# Patient Record
Sex: Female | Born: 1960 | Race: White | Hispanic: No | Smoking: Never smoker
Health system: Southern US, Community
[De-identification: ages and names within clinical notes are randomized; demographics above are authoritative.]

## PROBLEM LIST (undated history)

## (undated) DIAGNOSIS — Z87442 Personal history of urinary calculi: Secondary | ICD-10-CM

## (undated) DIAGNOSIS — N200 Calculus of kidney: Secondary | ICD-10-CM

## (undated) DIAGNOSIS — D649 Anemia, unspecified: Secondary | ICD-10-CM

## (undated) DIAGNOSIS — M1990 Unspecified osteoarthritis, unspecified site: Secondary | ICD-10-CM

## (undated) HISTORY — PX: COLONOSCOPY: SHX174

## (undated) HISTORY — PX: ABDOMINAL HYSTERECTOMY: SHX81

---

## 2010-04-18 ENCOUNTER — Emergency Department (HOSPITAL_COMMUNITY): Admission: EM | Admit: 2010-04-18 | Discharge: 2010-04-18 | Payer: Self-pay | Admitting: Emergency Medicine

## 2011-03-16 LAB — COMPREHENSIVE METABOLIC PANEL
CO2: 23 mEq/L (ref 19–32)
Calcium: 8.5 mg/dL (ref 8.4–10.5)
Chloride: 110 mEq/L (ref 96–112)
GFR calc non Af Amer: >60 mL/min (ref >60)
Potassium: 3.4 mEq/L — ABNORMAL LOW (ref 3.5–5.1)
Sodium: 139 mEq/L (ref 135–145)

## 2011-03-16 LAB — URINALYSIS, ROUTINE W REFLEX MICROSCOPIC
Bilirubin Urine: NEGATIVE
Ketones, ur: 15 mg/dL — AB
Protein, ur: 30 mg/dL — AB
Specific Gravity, Urine: 1.023 (ref 1.005–1.030)
Urobilinogen, UA: 1 mg/dL (ref 0.0–1.0)
pH: 7 (ref 5.0–8.0)

## 2011-03-16 LAB — DIFFERENTIAL
Basophils Absolute: 0 10*3/uL (ref 0.0–0.1)
Eosinophils Relative: 0 % (ref 0–5)
Lymphs Abs: 0.7 10*3/uL (ref 0.7–4.0)
Monocytes Absolute: 0.4 10*3/uL (ref 0.1–1.0)
Monocytes Relative: 4 % (ref 3–12)
Neutro Abs: 8.4 10*3/uL — ABNORMAL HIGH (ref 1.7–7.7)

## 2011-03-16 LAB — CBC
MCHC: 30.9 g/dL (ref 30.0–36.0)
WBC: 9.6 10*3/uL (ref 4.0–10.5)

## 2011-03-16 LAB — URINE MICROSCOPIC-ADD ON

## 2011-03-16 LAB — POCT PREGNANCY, URINE: Preg Test, Ur: NEGATIVE

## 2014-05-13 ENCOUNTER — Ambulatory Visit (INDEPENDENT_AMBULATORY_CARE_PROVIDER_SITE_OTHER): Payer: Self-pay | Admitting: Obstetrics & Gynecology

## 2014-05-13 ENCOUNTER — Encounter: Payer: Self-pay | Admitting: Obstetrics & Gynecology

## 2014-05-13 ENCOUNTER — Encounter (HOSPITAL_COMMUNITY): Payer: Self-pay | Admitting: Radiology

## 2014-05-13 ENCOUNTER — Encounter (HOSPITAL_COMMUNITY): Payer: Self-pay | Admitting: Emergency Medicine

## 2014-05-13 ENCOUNTER — Inpatient Hospital Stay (HOSPITAL_COMMUNITY): Payer: Medicaid Other

## 2014-05-13 ENCOUNTER — Emergency Department (HOSPITAL_COMMUNITY)
Admission: EM | Admit: 2014-05-13 | Discharge: 2014-05-13 | Disposition: A | Discharge status: Home / Self Care | Payer: Medicaid Other | Attending: Emergency Medicine | Admitting: Emergency Medicine

## 2014-05-13 ENCOUNTER — Inpatient Hospital Stay (HOSPITAL_COMMUNITY)
Admission: AD | Admit: 2014-05-13 | Discharge: 2014-05-13 | Disposition: A | Discharge status: Home / Self Care | Payer: Medicaid Other | Source: Ambulatory Visit | Attending: Obstetrics & Gynecology | Admitting: Obstetrics & Gynecology

## 2014-05-13 VITALS — BP 112/78 | HR 80 | Temp 98.4°F | Ht 65.0 in

## 2014-05-13 DIAGNOSIS — N814 Uterovaginal prolapse, unspecified: Secondary | ICD-10-CM | POA: Insufficient documentation

## 2014-05-13 DIAGNOSIS — R112 Nausea with vomiting, unspecified: Secondary | ICD-10-CM | POA: Insufficient documentation

## 2014-05-13 DIAGNOSIS — N1330 Unspecified hydronephrosis: Secondary | ICD-10-CM

## 2014-05-13 DIAGNOSIS — Q6589 Other specified congenital deformities of hip: Secondary | ICD-10-CM | POA: Insufficient documentation

## 2014-05-13 DIAGNOSIS — N819 Female genital prolapse, unspecified: Secondary | ICD-10-CM | POA: Diagnosis present

## 2014-05-13 DIAGNOSIS — R197 Diarrhea, unspecified: Secondary | ICD-10-CM | POA: Insufficient documentation

## 2014-05-13 DIAGNOSIS — Z87442 Personal history of urinary calculi: Secondary | ICD-10-CM | POA: Insufficient documentation

## 2014-05-13 DIAGNOSIS — K8020 Calculus of gallbladder without cholecystitis without obstruction: Secondary | ICD-10-CM | POA: Insufficient documentation

## 2014-05-13 DIAGNOSIS — N926 Irregular menstruation, unspecified: Secondary | ICD-10-CM | POA: Insufficient documentation

## 2014-05-13 DIAGNOSIS — M129 Arthropathy, unspecified: Secondary | ICD-10-CM | POA: Insufficient documentation

## 2014-05-13 DIAGNOSIS — Q6101 Congenital single renal cyst: Secondary | ICD-10-CM | POA: Insufficient documentation

## 2014-05-13 HISTORY — DX: Calculus of kidney: N20.0

## 2014-05-13 HISTORY — DX: Unspecified osteoarthritis, unspecified site: M19.90

## 2014-05-13 LAB — BASIC METABOLIC PANEL
BUN: 12 mg/dL (ref 6–23)
CALCIUM: 8.9 mg/dL (ref 8.4–10.5)
CHLORIDE: 104 meq/L (ref 96–112)
CO2: 26 meq/L (ref 19–32)
Creatinine, Ser: 0.77 mg/dL (ref 0.50–1.10)
GFR calc non Af Amer: >90 mL/min (ref >90)
Glucose, Bld: 110 mg/dL — ABNORMAL HIGH (ref 70–99)
POTASSIUM: 3.3 meq/L — AB (ref 3.7–5.3)
SODIUM: 143 meq/L (ref 137–147)

## 2014-05-13 LAB — CBC WITH DIFFERENTIAL/PLATELET
BASOS PCT: 0 % (ref 0–1)
Basophils Absolute: 0 10*3/uL (ref 0.0–0.1)
EOS PCT: 2 % (ref 0–5)
Eosinophils Absolute: 0.1 10*3/uL (ref 0.0–0.7)
HEMATOCRIT: 37.3 % (ref 36.0–46.0)
Hemoglobin: 12.7 g/dL (ref 12.0–15.0)
Lymphocytes Relative: 16 % (ref 12–46)
Lymphs Abs: 1.1 10*3/uL (ref 0.7–4.0)
MCH: 27.4 pg (ref 26.0–34.0)
MCHC: 34 g/dL (ref 30.0–36.0)
MCV: 80.6 fL (ref 78.0–100.0)
Monocytes Absolute: 0.6 10*3/uL (ref 0.1–1.0)
Monocytes Relative: 9 % (ref 3–12)
NEUTROS PCT: 73 % (ref 43–77)
Neutro Abs: 5 10*3/uL (ref 1.7–7.7)
PLATELETS: 246 10*3/uL (ref 150–400)
RBC: 4.63 MIL/uL (ref 3.87–5.11)
RDW: 14.5 % (ref 11.5–15.5)
WBC: 6.9 10*3/uL (ref 4.0–10.5)

## 2014-05-13 LAB — CREATININE, SERUM
Creatinine, Ser: 0.9 mg/dL (ref 0.50–1.10)
GFR calc Af Amer: 83 mL/min — ABNORMAL LOW (ref >90)
GFR calc non Af Amer: 72 mL/min — ABNORMAL LOW (ref >90)

## 2014-05-13 LAB — HCG, SERUM, QUALITATIVE: Preg, Serum: NEGATIVE

## 2014-05-13 MED ORDER — SODIUM CHLORIDE 0.9 % IV SOLN: 1000.0000 mL | INTRAVENOUS | Status: DC

## 2014-05-13 MED ORDER — ONDANSETRON HCL 4 MG/2ML IJ SOLN
4.0000 mg | Freq: Once | INTRAMUSCULAR | Status: AC
  Administered 2014-05-13: 4 mg via INTRAVENOUS
  Filled 2014-05-13: qty 2

## 2014-05-13 MED ORDER — IOHEXOL 300 MG/ML  SOLN
100.0000 mL | Freq: Once | INTRAMUSCULAR | Status: AC | PRN
  Administered 2014-05-13: 100 mL via INTRAVENOUS

## 2014-05-13 MED ORDER — GI COCKTAIL ~~LOC~~
30.0000 mL | Freq: Once | ORAL | Status: AC
  Administered 2014-05-13: 30 mL via ORAL
  Filled 2014-05-13: qty 30

## 2014-05-13 MED ORDER — SODIUM CHLORIDE 0.9 % IV SOLN
1000.0000 mL | Freq: Once | INTRAVENOUS | Status: AC
  Administered 2014-05-13: 1000 mL via INTRAVENOUS

## 2014-05-13 MED ORDER — OXYCODONE-ACETAMINOPHEN 5-325 MG PO TABS
1.0000 | ORAL_TABLET | Freq: Once | ORAL | Status: AC
  Administered 2014-05-13: 1 via ORAL
  Filled 2014-05-13: qty 1

## 2014-05-13 MED ORDER — ONDANSETRON 8 MG PO TBDP
8.0000 mg | ORAL_TABLET | Freq: Three times a day (TID) | ORAL | Status: DC | PRN
Start: 2014-05-13 — End: 2014-05-13

## 2014-05-13 NOTE — ED Notes (Signed)
Per Dr. Patria Maneampos - pt to be d/c'd to [facility] as they are expecting to see her.  Pt and family aware of plan and verbalize understanding.  Family to provide transport at this time.

## 2014-05-13 NOTE — Progress Notes (Signed)
Subjective:     Patient ID: Mariah CalkinsRaissa Morrison, female   DOB: 05/07/1961, 53 y.o.   MRN: [id number]  HPI  Pt reports 2-4 days of leaking urine.  She reports that yesterday she felt a mass this.  She denies h/o pain prior to this with the exception of  her arthritis.  She reports  abd and pelvic (side pain) today.  She denies assoc GI sx.  She reports NO small OR large bulge in the past.     Review of Systems     Objective:   Physical Exam BP 112/78  Pulse 80  Temp(Src) 98.4 F (36.9 C) (Oral)  Ht 5\' 5"  (1.651 m) Pt sitting on exam table crying and stating that she is in extreme pain.   GU: EGBUS: ~12cm mass between legs.  Tender to palpation A full bimanual exam was not done.       Assessment:     Given pts abd pain and pain from this mass, I recommend that pt be transferred to the MAU to get a CT scan.  It is not likely to be a simple uterine prolapse as that should not be painful.  It could be a bladder with the uterus but I recommend a CT first to ascertain WHAT is prolapsing.  It is also too tender to replace in ofc.      Plan:     To MAU as above.  Dr. Penne LashLeggett and charge nurse called

## 2014-05-13 NOTE — ED Notes (Signed)
Patient from home unable to ambulate well from arthritis, called EMS due to pelvic pain for five days with vaginal swelling.  Patient also reports incontinent of stool and urine today.  Patient laying on the floor at home then had bowel movement.  Patient lives alone but has a daughter who is with her today.  Alert and oriented.  Vomited yesterday.

## 2014-05-13 NOTE — MAU Provider Note (Signed)
History     CSN: [id number]  Arrival date and time: 05/13/14 1445   First Provider Initiated Contact with Patient 05/13/14 2009      No chief complaint on file.  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  Pt has been having 3 days of N/V/D and today felt large bulge between her legs.  Pt went to ED and had uterus reduced by ED MD.  Organs prolapsed again.  Clinic appt was arranged for this afternoon for eval / pessary fitting.  When pt arrived she was emotional and having upper GI pain (pt was very hungry at this point).  When Dr. Erin FullingHarraway Smith attempted to reduce procidentia, the patient became very upset.  Pt was sent to ED for eval.    When I arrived pt was resting comfortabley and admited to upper GI pain.  GI cocktail given.    Pt had CT performed with findings as follows: 1. Prolapse of the uterus, urinary bladder/ureters, and rectum.  2. 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  3. Cholelithiasis.  4. Bilateral renal cysts.  5. 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  6. Changes consistent with chronic inflammation of the colon.  Pt has been having irregular menstrual bleeding for approximately 3 years.  Pt had menopausal symptoms 3 years ago but has not had amenorrhea for one year yet.  Pt is currently having light vaginal bleeding.  Pt has not accessed ehatlh care sytem and has not had pap smear or irregular bleeding evaluated.     Past Medical History  Diagnosis Date  . Kidney calculi   . Arthritis     severe    History reviewed. No pertinent past surgical history.  Family History  Problem Relation Age of Onset  . Cancer Father   . Heart disease Father     History  Substance Use Topics  . Smoking status: Never Smoker   . Smokeless tobacco: Never Used  . Alcohol  Use: No    Allergies:  Allergies  Allergen Reactions  . No Known Allergies     Prescriptions prior to admission  Medication Sig Dispense Refill  . ibuprofen (ADVIL) 200 MG tablet Take 400 mg by mouth every 6 (six) hours as needed for moderate pain.         Review of Systems  Constitutional: Negative.   HENT: Negative.   Respiratory: Negative.   Cardiovascular: Negative.   Gastrointestinal: Positive for heartburn, nausea, vomiting and diarrhea.  Genitourinary:       POP prolapse, vaginal bleeding, no ulcers on vaginal mucosa Urinary incontinence  Psychiatric/Behavioral: Negative.    Physical Exam   Blood pressure 124/70, pulse 71, temperature 97.5 F (36.4 C), temperature source Oral, resp. rate 16, height 5\' 5"  (1.651 m), weight 200 lb (90.719 kg), last menstrual period 01/13/2014.  Physical Exam  Vitals reviewed. Constitutional: She is oriented to person, place, and time. She appears well-developed and well-nourished. No distress.  HENT:  Head: Normocephalic and atraumatic.  Neck: Neck supple.  Respiratory: Effort normal.  GI: Soft. She exhibits no distension. There is no tenderness. There is no rebound and no guarding.  Genitourinary:  Complete procidentia approximately 12 inches below labia, no ulcerations noted on vaginal mucosa  Once reduced posterior forchette >4 finger breadths across.   Musculoskeletal:  Limited range of motion.  Can't abduct hips  Neurological: She is alert and oriented to person, place, and time.  Skin: Skin is warm and dry.  Hirsutism on face  Psychiatric:  Tearful, but appropriate.    MAU Course  Procedures Pap smear Endometrial biopsy  ENDOMETRIAL BIOPSY     The indications for endometrial biopsy were reviewed.   Risks of the biopsy including cramping, bleeding, infection, uterine perforation, inadequate specimen and need for additional procedures  were discussed. The patient states she understands and agrees to undergo procedure  today. Consent was signed. Time out was performed. Urine HCG was negative. The cervix was prepped with Betadine. The 3 mm pipelle was introduced into the endometrial cavity without difficulty to a depth of 11 cm, and a moderate amount of tissue was obtained and sent to pathology. The instruments were removed from the patient's vagina. Minimal bleeding from the cervix was noted. The patient tolerated the procedure well.    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 Not large enough and expelled with talking.   3-Pt can ambulate and is having leakage of urine 4-Pt has help at home tonight 5-Pt ate hamburger and pudding without N/V/D or abdominal pain 6-[facility].  They will work her in tomorrow.  Pt may have transportation issues so will have to work on this as well tomorrow morning.  Loyce DysMarni Smith and [employer] are aware of the needs and will be helping to arrange transport / appt. 7-If patient can't be seen tomorrow, will need to call to check on patient and make sure she is still voiding and ambulating.       Lesly DukesKelly H Astria Jordahl 05/13/2014, 8:28 PM

## 2014-05-13 NOTE — ED Notes (Signed)
Initial Contact - pt resting on stretcher, pt reports feeling better at this time.  Pt denies needs/complaints.  IV infiltrated and removed at this time.  Dr. Patria Maneampos at bedside for re-eval.  Pt awaiting d/c paperwork.  Skin PWD.  A+Ox4.  Self repositioning for comfort.  NAD.

## 2014-05-13 NOTE — ED Notes (Signed)
Pt reports her uterus came out again, md made aware. Pt back in ED room.

## 2014-05-13 NOTE — ED Provider Notes (Addendum)
CSN: [id number]     Arrival date & time 05/13/14  [id number] History   First MD Initiated Contact with Patient 05/13/14 [phone]     Chief Complaint  Patient presents with  . Pelvic Pain     The history is provided by the patient.   patient reports nausea and vomiting and diarrhea over the past 3 days.  She states she's been unable to keep significant oral fluids down.  Approximately 2 days ago she developed swelling and mass down in her vaginal area.  She has a history of vaginal prolapse before in the past but never as severe as this.  Her uterus is unable to be placed back in her vagina the patient.  She reports some pelvic discomfort at this time.  Patient incontinent of stool and urine today.  Past Medical History  Diagnosis Date  . Arthritis   . Kidney calculi    No past surgical history on file. No family history on file. History  Substance Use Topics  . Smoking status: Not on file  . Smokeless tobacco: Not on file  . Alcohol Use: Not on file   OB History   Grav Para Term Preterm Abortions TAB SAB Ect Mult Living                 Review of Systems  Genitourinary: Positive for pelvic pain.  All other systems reviewed and are negative.     Allergies  No known allergies  Home Medications   Prior to Admission medications   Not on File   BP 120/74  Pulse 86  Temp(Src) 98.4 F (36.9 C) (Oral)  Resp 16  SpO2 99% Physical Exam  Nursing note and vitals reviewed. Constitutional: She is oriented to person, place, and time. She appears well-developed and well-nourished. No distress.  HENT:  Head: Normocephalic and atraumatic.  Eyes: EOM are normal.  Neck: Normal range of motion.  Cardiovascular: Normal rate, regular rhythm and normal heart sounds.   Pulmonary/Chest: Effort normal and breath sounds normal.  Abdominal: Soft. She exhibits no distension. There is no tenderness.  Genitourinary:  Large and nearly complete prolapse of her uterus.  No bleeding.  No tenderness of  the uterus.   Musculoskeletal: Normal range of motion.  Neurological: She is alert and oriented to person, place, and time.  Skin: Skin is warm and dry.  Psychiatric: She has a normal mood and affect. Judgment normal.    ED Course  Uterine Prolapse reduction Date/Time: 05/13/2014 10:15 AM Performed by: Lyanne CoAMPOS, Monea Pesantez M Authorized by: Lyanne CoAMPOS, Delva Derden M Consent: Verbal consent obtained. Consent given by: patient Local anesthesia used: no Patient sedated: no Patient tolerance: Patient tolerated the procedure well with no immediate complications. Comments: Reduction of uterine prolapse with manual reduction technique   (including critical care time) Labs Review Labs Reviewed  BASIC METABOLIC PANEL - Abnormal; Notable for the following:    Potassium 3.3 (*)    Glucose, Bld 110 (*)    All other components within normal limits  CBC WITH DIFFERENTIAL    Imaging Review No results found.   EKG Interpretation None      MDM   Final diagnoses:  Nausea vomiting and diarrhea  Uterine prolapse    Patient feels much better after reduction of uterine prolapse.  This was reduced without significant difficulty the bedside.  Abdominal exam is benign.  Patient feels much better after reduction of her prolapse uterus.  I suspect this occurred from Valsalva maneuver from her vomiting over  the past several days.  Labs, fluids, antiemetics at this time.  Patient does want anything for pain at this time as her pain is improved after reduction of her prolapsed uterus.    11:22 AM Patient feels much better.  Home with Zofran.  Home with OB/GYN followup.  She understands return to the ER for new or worsening symptoms.    Lyanne CoKevin M Beva Remund, MD 05/13/14 1122  12:52 PM At discharge the patient was being placed back on and her car when her uterus prolapse completely again.  I discussed her case with Dr. leg it of OB/GYN.  She spoke with the team [facility] who can [facility] her  now.  Patient will be transported via family and personal vehicle to the GYN clinic at this time.  Lyanne CoKevin M Tore Carreker, MD 05/13/14 [phone]

## 2014-05-13 NOTE — Progress Notes (Signed)
[facility] provided pt with a list of primary care resources and a [facility] [employer] Card application to help patient establish primary care.  °

## 2014-05-13 NOTE — ED Notes (Signed)
Pt back to [facility] with family, reports "it came back out again".  Pt c/o pain to site, 8/10.  Pt denies other complaints.  Dr. Patria Maneampos aware. Pt resting on stretcher with call bell in reach.

## 2014-05-13 NOTE — MAU Provider Note (Signed)
Call from [place] in Clinic reporting that patient sent from clinic with organ prolapse.  Dr. Erin FullingHarraway-Smith gave order for Cath UA, CBC diff, and CT abd/pelvic.   Patient unable to void, ordered serum preg pre CT order.  [facility].

## 2014-05-13 NOTE — ED Notes (Signed)
[facility]omens called and requesting [facility] is [facility]

## 2014-05-13 NOTE — ED Notes (Signed)
Bed: [id number] Expected date:  Expected time:  Means of arrival:  Comments: ems- n/v, vaginal pain

## 2014-05-14 ENCOUNTER — Telehealth (HOSPITAL_COMMUNITY): Payer: Self-pay | Admitting: Obstetrics & Gynecology

## 2014-05-14 NOTE — Telephone Encounter (Signed)
[facility] to coordinate [facility].  [facility] worked patient in for 2:45 pm today, originally patient indicated she could not get transportation until tomorrow, but now states "she will make it work to get there".  [facility] notified patient of $100 copay, patient indicated she could not pay this, but then agreed to pay if she were to be admitted.  Explained to patient that admission could not be confirmed until receiving physician examines her and assured patient that [facility] would coordinate any admissions/surgery for her.  Patient was in agreement with plan.  The appointment is 05/14/14 at 2:45pm in the [facility] office ( [address] [place] [id number]).  All records faxed to office at [phone].    I did speak with Tedra/ SW and she said hospital would not be able to authorize a taxi for trip from [place] to [place].

## 2014-05-16 ENCOUNTER — Encounter (HOSPITAL_COMMUNITY): Payer: Self-pay | Admitting: General Practice

## 2014-05-16 ENCOUNTER — Inpatient Hospital Stay (HOSPITAL_COMMUNITY)
Admission: AD | Admit: 2014-05-16 | Discharge: 2014-05-16 | Disposition: A | Discharge status: Home / Self Care | Payer: Medicaid Other | Source: Ambulatory Visit | Attending: Obstetrics & Gynecology | Admitting: Obstetrics & Gynecology

## 2014-05-16 DIAGNOSIS — R110 Nausea: Secondary | ICD-10-CM | POA: Insufficient documentation

## 2014-05-16 DIAGNOSIS — N814 Uterovaginal prolapse, unspecified: Secondary | ICD-10-CM | POA: Insufficient documentation

## 2014-05-16 DIAGNOSIS — R109 Unspecified abdominal pain: Secondary | ICD-10-CM | POA: Insufficient documentation

## 2014-05-16 LAB — CBC WITH DIFFERENTIAL/PLATELET
BASOS PCT: 1 % (ref 0–1)
Basophils Absolute: 0 10*3/uL (ref 0.0–0.1)
EOS ABS: 0.2 10*3/uL (ref 0.0–0.7)
Eosinophils Relative: 2 % (ref 0–5)
HCT: 36.8 % (ref 36.0–46.0)
Hemoglobin: 12.5 g/dL (ref 12.0–15.0)
Lymphocytes Relative: 20 % (ref 12–46)
Lymphs Abs: 1.6 10*3/uL (ref 0.7–4.0)
MCH: 28 pg (ref 26.0–34.0)
MCHC: 34 g/dL (ref 30.0–36.0)
MCV: 82.3 fL (ref 78.0–100.0)
MONOS PCT: 9 % (ref 3–12)
Monocytes Absolute: 0.7 10*3/uL (ref 0.1–1.0)
NEUTROS ABS: 5.4 10*3/uL (ref 1.7–7.7)
Neutrophils Relative %: 68 % (ref 43–77)
PLATELETS: 240 10*3/uL (ref 150–400)
RBC: 4.47 MIL/uL (ref 3.87–5.11)
RDW: 14.8 % (ref 11.5–15.5)
WBC: 7.9 10*3/uL (ref 4.0–10.5)

## 2014-05-16 LAB — BASIC METABOLIC PANEL
BUN: 12 mg/dL (ref 6–23)
CHLORIDE: 104 meq/L (ref 96–112)
CO2: 29 mEq/L (ref 19–32)
Calcium: 9.1 mg/dL (ref 8.4–10.5)
Creatinine, Ser: 0.71 mg/dL (ref 0.50–1.10)
GFR calc Af Amer: >90 mL/min (ref >90)
GLUCOSE: 97 mg/dL (ref 70–99)
Potassium: 3.3 mEq/L — ABNORMAL LOW (ref 3.7–5.3)
Sodium: 144 mEq/L (ref 137–147)

## 2014-05-16 MED ORDER — ONDANSETRON HCL 4 MG/2ML IJ SOLN
4.0000 mg | Freq: Four times a day (QID) | INTRAMUSCULAR | Status: DC | PRN
  Administered 2014-05-16: 4 mg via INTRAVENOUS
  Filled 2014-05-16: qty 2

## 2014-05-16 MED ORDER — PROMETHAZINE HCL 25 MG PO TABS: 25.0000 mg | ORAL_TABLET | Freq: Four times a day (QID) | ORAL | Status: DC | PRN

## 2014-05-16 MED ORDER — HYDROMORPHONE HCL PF 1 MG/ML IJ SOLN
1.0000 mg | INTRAMUSCULAR | Status: DC | PRN
  Administered 2014-05-16: 1 mg via INTRAVENOUS
  Filled 2014-05-16: qty 1

## 2014-05-16 MED ORDER — OXYCODONE-ACETAMINOPHEN 5-325 MG PO TABS: 1.0000 | ORAL_TABLET | Freq: Four times a day (QID) | ORAL | Status: DC | PRN

## 2014-05-16 MED ORDER — LACTATED RINGERS IV SOLN
INTRAVENOUS | Status: DC
  Administered 2014-05-16: 12:00:00 via INTRAVENOUS

## 2014-05-16 NOTE — MAU Note (Signed)
Pt has been seen by Dr. Penne LashLeggett for uterine prolapse and states she was also sent to be seen in [place] on Tues., awaiting results.  Pt states she is unable to hold her urine and is experiencing vaginal bleeding soaking a pad an hour.

## 2014-05-16 NOTE — Discharge Instructions (Signed)
Abdominal Pain, Adult °Many things can cause abdominal pain. Usually, abdominal pain is not caused by a disease and will improve without treatment. It can often be observed and treated at home. Your health care provider will do a physical exam and possibly order blood tests and X-rays to help determine the seriousness of your pain. However, in many cases, more time must pass before a clear cause of the pain can be found. Before that point, your health care provider may not know if you need more testing or further treatment. °HOME CARE INSTRUCTIONS  °Monitor your abdominal pain for any changes. The following actions may help to alleviate any discomfort you are experiencing: °· Only take over-the-counter or prescription medicines as directed by your health care provider. °· Do not take laxatives unless directed to do so by your health care provider. °· Try a clear liquid diet (broth, tea, or water) as directed by your health care provider. Slowly move to a bland diet as tolerated. °SEEK MEDICAL CARE IF: °· You have unexplained abdominal pain. °· You have abdominal pain associated with nausea or diarrhea. °· You have pain when you urinate or have a bowel movement. °· You experience abdominal pain that wakes you in the night. °· You have abdominal pain that is worsened or improved by eating food. °· You have abdominal pain that is worsened with eating fatty foods. °SEEK IMMEDIATE MEDICAL CARE IF:  °· Your pain does not go away within 2 hours. °· You have a fever. °· You keep throwing up (vomiting). °· Your pain is felt only in portions of the abdomen, such as the right side or the left lower portion of the abdomen. °· You pass bloody or black tarry stools. °MAKE SURE YOU: °· Understand these instructions.   °· Will watch your condition.   °· Will get help right away if you are not doing well or get worse.   °Document Released: 09/22/2005 Document Revised: 10/03/2013 Document Reviewed: 08/22/2013 °ExitCare® Patient  Information ©2014 [employer]. ° °

## 2014-05-16 NOTE — MAU Provider Note (Signed)
  History     CSN: [id number]  Arrival date and time: 05/16/14 1115   First Provider Initiated Contact with Patient 05/16/14 1156      No chief complaint on file.  HPI [id number] Patient's last menstrual period was 01/13/2014. Patient returns with pain and nausea and symptoms of complete procidentia. Sx began more than 3 days ago. She was [facility] for an appointment to see Dr. Noland Ford[facility]Lentz or [facility]. [facility] yesterday in [place].  Pertinent Gynecological History: Menses: post-menopausal Bleeding: post menopausal bleeding Contraception: none DES exposure: denies  Last pap: pending Date: 05/13/14   Past Medical History  Diagnosis Date  . Kidney calculi   . Arthritis     severe    History reviewed. No pertinent past surgical history.  Family History  Problem Relation Age of Onset  . Cancer Father   . Heart disease Father     History  Substance Use Topics  . Smoking status: Never Smoker   . Smokeless tobacco: Never Used  . Alcohol Use: No    Allergies:  Allergies  Allergen Reactions  . No Known Allergies     Prescriptions prior to admission  Medication Sig Dispense Refill  . ibuprofen (ADVIL) 200 MG tablet Take 400 mg by mouth every 6 (six) hours as needed for moderate pain.         Review of Systems  Constitutional: Negative for fever.  Gastrointestinal: Positive for nausea and vomiting.  Psychiatric/Behavioral: The patient has insomnia.    Physical Exam   Blood pressure 136/82, pulse 75, temperature 98 F (36.7 C), temperature source Oral, resp. rate 18, last menstrual period 01/13/2014.  Physical Exam  Constitutional: She is oriented to person, place, and time. She appears well-developed. She appears distressed.  Respiratory: Effort normal. No respiratory distress.  GI: Soft. She exhibits no distension.  Genitourinary: No vaginal discharge found.  Complete vaginal vault prolapse  Neurological: She is alert and oriented to person,  place, and time.  Skin: Skin is warm and dry.   CBC    Component Value Date/Time   WBC 7.9 05/16/2014 1225   RBC 4.47 05/16/2014 1225   HGB 12.5 05/16/2014 1225   HCT 36.8 05/16/2014 1225   PLT 240 05/16/2014 1225   MCV 82.3 05/16/2014 1225   MCH 28.0 05/16/2014 1225   MCHC 34.0 05/16/2014 1225   RDW 14.8 05/16/2014 1225   LYMPHSABS 1.6 05/16/2014 1225   MONOABS 0.7 05/16/2014 1225   EOSABS 0.2 05/16/2014 1225   BASOSABS 0.0 05/16/2014 1225    BMET    Component Value Date/Time   NA 144 05/16/2014 1225   K 3.3* 05/16/2014 1225   CL 104 05/16/2014 1225   CO2 29 05/16/2014 1225   GLUCOSE 97 05/16/2014 1225   BUN 12 05/16/2014 1225   CREATININE 0.71 05/16/2014 1225   CALCIUM 9.1 05/16/2014 1225   GFRNONAA >90 05/16/2014 1225   GFRAA >90 05/16/2014 1225      MAU Course  Procedures  MDM complex  Assessment and Plan  I [facility] to [facility] Jol[facility]akavid Goodman [facility] in urogynecology clinic who scheduled her for appt tomorrow 1030 500 [address] [place] Pain med and antiemetic for home med   Mariah Morrison 05/16/2014, 2:20 PM

## 2014-05-16 NOTE — MAU Note (Signed)
Pt was cleaned and assisted to dress giving her dry, disposable jacket and pants. Clean pad placed with mesh panties. Pt assisted to car via w/c.

## 2014-05-17 NOTE — MAU Provider Note (Signed)
See full ED note I completed.

## 2014-05-17 NOTE — Progress Notes (Signed)
Chart opened and portions printed to fax to [facility] after release of info. received.

## 2014-05-20 ENCOUNTER — Encounter (HOSPITAL_COMMUNITY): Payer: Self-pay | Admitting: *Deleted

## 2014-05-20 ENCOUNTER — Inpatient Hospital Stay (HOSPITAL_COMMUNITY)
Admission: EM | Admit: 2014-05-20 | Discharge: 2014-05-20 | Disposition: A | Discharge status: Home / Self Care | Payer: Medicaid Other | Source: Ambulatory Visit | Attending: Obstetrics & Gynecology | Admitting: Obstetrics & Gynecology

## 2014-05-20 DIAGNOSIS — N939 Abnormal uterine and vaginal bleeding, unspecified: Secondary | ICD-10-CM

## 2014-05-20 DIAGNOSIS — N819 Female genital prolapse, unspecified: Secondary | ICD-10-CM

## 2014-05-20 DIAGNOSIS — N926 Irregular menstruation, unspecified: Secondary | ICD-10-CM

## 2014-05-20 DIAGNOSIS — N950 Postmenopausal bleeding: Secondary | ICD-10-CM | POA: Insufficient documentation

## 2014-05-20 DIAGNOSIS — R112 Nausea with vomiting, unspecified: Secondary | ICD-10-CM | POA: Insufficient documentation

## 2014-05-20 DIAGNOSIS — N814 Uterovaginal prolapse, unspecified: Secondary | ICD-10-CM

## 2014-05-20 LAB — CBC
HEMATOCRIT: 35.3 % — AB (ref 36.0–46.0)
HEMOGLOBIN: 11.7 g/dL — AB (ref 12.0–15.0)
MCH: 27.6 pg (ref 26.0–34.0)
MCHC: 33.1 g/dL (ref 30.0–36.0)
MCV: 83.3 fL (ref 78.0–100.0)
Platelets: 226 10*3/uL (ref 150–400)
RBC: 4.24 MIL/uL (ref 3.87–5.11)
RDW: 14.9 % (ref 11.5–15.5)
WBC: 6 10*3/uL (ref 4.0–10.5)

## 2014-05-20 LAB — COMPREHENSIVE METABOLIC PANEL
ALT: 67 U/L — ABNORMAL HIGH (ref 0–35)
AST: 49 U/L — AB (ref 0–37)
Albumin: 3.1 g/dL — ABNORMAL LOW (ref 3.5–5.2)
Alkaline Phosphatase: 67 U/L (ref 39–117)
BUN: 11 mg/dL (ref 6–23)
CO2: 28 meq/L (ref 19–32)
CREATININE: 0.78 mg/dL (ref 0.50–1.10)
Calcium: 8.7 mg/dL (ref 8.4–10.5)
Chloride: 105 mEq/L (ref 96–112)
GFR calc non Af Amer: >90 mL/min (ref >90)
GLUCOSE: 95 mg/dL (ref 70–99)
Potassium: 3.3 mEq/L — ABNORMAL LOW (ref 3.7–5.3)
Sodium: 145 mEq/L (ref 137–147)
Total Bilirubin: 0.3 mg/dL (ref 0.3–1.2)
Total Protein: 6.2 g/dL (ref 6.0–8.3)

## 2014-05-20 MED ORDER — FERROUS SULFATE 325 (65 FE) MG PO TABS: 325.0000 mg | ORAL_TABLET | Freq: Two times a day (BID) | ORAL | Status: DC

## 2014-05-20 MED ORDER — DOCUSATE SODIUM 100 MG PO CAPS: 100.0000 mg | ORAL_CAPSULE | Freq: Two times a day (BID) | ORAL | Status: DC | PRN

## 2014-05-20 MED ORDER — MEGESTROL ACETATE 20 MG PO TABS: 40.0000 mg | ORAL_TABLET | Freq: Two times a day (BID) | ORAL | Status: DC

## 2014-05-20 NOTE — Discharge Instructions (Signed)

## 2014-05-20 NOTE — MAU Provider Note (Signed)
History     CSN: [id number]  Arrival date and time: 05/20/14 1005   First Provider Initiated Contact with Patient 05/16/14 1156      Chief Complaint  Patient presents with  . Vaginal Bleeding   Vaginal Bleeding Associated symptoms include nausea and vomiting. Pertinent negatives include no fever.   [id number] Patient's last menstrual period was 01/13/2014. Patient returns with increased vaginal bleeding and symptoms of complete procidentia; has been seen several times here. She has been seen at [facility] and scheduled for surgery. She came here because "the ambulance brought me here".  Reports severe bleeding, "blood was on the floor". She is screaming and cursing, saying she is "tired of everything, want surgery now".  Accompanied by her daughter who is also very upset.   Pertinent Gynecological History: Menses: post-menopausal Bleeding: post menopausal bleeding Contraception: none DES exposure: denies  Last pap: pending Date: 05/13/14   Past Medical History  Diagnosis Date  . Kidney calculi   . Arthritis     severe    History reviewed. No pertinent past surgical history.  Family History  Problem Relation Age of Onset  . Cancer Father   . Heart disease Father     History  Substance Use Topics  . Smoking status: Never Smoker   . Smokeless tobacco: Never Used  . Alcohol Use: No    Allergies:  Allergies  Allergen Reactions  . No Known Allergies     Prescriptions prior to admission  Medication Sig Dispense Refill  . ibuprofen (ADVIL) 200 MG tablet Take 400 mg by mouth every 6 (six) hours as needed for moderate pain.       Marland Kitchen oxyCODONE-acetaminophen (PERCOCET/ROXICET) 5-325 MG per tablet Take 1-2 tablets by mouth every 6 (six) hours as needed.  30 tablet  0  . promethazine (PHENERGAN) 25 MG tablet Take 1 tablet (25 mg total) by mouth every 6 (six) hours as needed for nausea or vomiting.  30 tablet  0    Review of Systems  Constitutional: Negative for fever.   Gastrointestinal: Positive for nausea and vomiting.  Genitourinary: Positive for vaginal bleeding.  Psychiatric/Behavioral: The patient has insomnia.    Physical Exam  Temp(Src) 98.2 F (36.8 C) (Oral)  LMP 01/13/2014 BP 138/80, P 88  Physical Exam  Constitutional: She is oriented to person, place, and time. She appears well-developed. She appears distressed.  Respiratory: Effort normal. No respiratory distress.  GI: Soft. She exhibits no distension.  Genitourinary: No vaginal discharge found.  Complete vaginal vault prolapse Small amount of bleeding noted from cervix  Neurological: She is alert and oriented to person, place, and time.  Skin: Skin is warm and dry.   CBC    Component Value Date/Time   WBC 6.0 05/20/2014 1023   RBC 4.24 05/20/2014 1023   HGB 11.7* 05/20/2014 1023   HCT 35.3* 05/20/2014 1023   PLT 226 05/20/2014 1023   MCV 83.3 05/20/2014 1023   MCH 27.6 05/20/2014 1023   MCHC 33.1 05/20/2014 1023   RDW 14.9 05/20/2014 1023   LYMPHSABS 1.6 05/16/2014 1225   MONOABS 0.7 05/16/2014 1225   EOSABS 0.2 05/16/2014 1225   BASOSABS 0.0 05/16/2014 1225  Hgb stable from 12.5 on 05/16/14  BMET    Component Value Date/Time   NA 145 05/20/2014 1023   K 3.3* 05/20/2014 1023   CL 105 05/20/2014 1023   CO2 28 05/20/2014 1023   GLUCOSE 95 05/20/2014 1023   BUN 11 05/20/2014 1023   CREATININE 0.78  05/20/2014 1023   CALCIUM 8.7 05/20/2014 1023   GFRNONAA >90 05/20/2014 1023   GFRAA >90 05/20/2014 1023  Creatinine stable, as 0.71 on 05/16/14.    MAU Course  Procedures Vitals stable Exam remarkable for small amount of bleeding; complete procidentia Labs checked   Assessment and Plan  Complete procidentia with AUB Will try Megace for AUB for now. Iron and Colace also prescribed. Continue pain medications as needed [facility] as [facility]; [facility] concerns    Tereso NewcomerUgonna A Aliviah Spain, MD 05/20/2014, 11:02 AM

## 2014-05-20 NOTE — MAU Note (Signed)
Has a prolapsed uterus and is scheduled for surgery at [facility] but does not wish to wait; c/o pain; frustrated with the bleeding and incontinence:

## 2014-05-22 ENCOUNTER — Telehealth: Payer: Self-pay | Admitting: *Deleted

## 2014-05-22 NOTE — Telephone Encounter (Signed)
Called Mariah Morrison and informed her pap smear results came back as insufficient for evaluation- meaning we need to recollect.  She states she is not going to [facility]- has decided to go to [facility] and saw them last Friday.  I asked her if they did a pap smear and she states she is not sure what all they did. States she has surgery June 4. I asked her if she could find out if she had a pap smear as we did not want to repeat if they did and they might want to know result before surgery.   Patient states she is in a lot of pain and does not want to deal with that right now as she might be going back to ER today.

## 2014-05-22 NOTE — Telephone Encounter (Signed)
Message copied by Gerome Apley on Wed May 22, 2014  9:09 AM ------      Message from: Lesly Dukes      Created: Fri May 17, 2014 10:33 AM       Will need to repeat pap if pt hasn't had hysterectomy yet. ------

## 2014-05-22 NOTE — Telephone Encounter (Signed)
Mariah Morrison called back and left a message stating she spoke with someone this am  And states she doesn't know what tell Dr. Penne Lash. States not sure what is going on, request a call.  Called Mariah Morrison back and we discussed i spoke with her earlier and we discussed I had already passed on what she told me earlier to Dr. Penne Lash- that she is going to [facility] with surgery planned 05/30/14. She states she couldn't go to [facility]- is too far so they transferred her to [facility].  I informed her Dr. Penne Lash said since [facility] is taking over her care she does not need to shedule an appointment with [place]- they will do whatever is needed.  Mariah Morrison thanked me for my call. I wished her good luck with her surgery .

## 2014-06-07 HISTORY — PX: ABDOMINAL HYSTERECTOMY: SHX81

## 2014-08-15 ENCOUNTER — Encounter: Payer: Self-pay | Admitting: *Deleted

## 2014-10-28 ENCOUNTER — Encounter (HOSPITAL_COMMUNITY): Payer: Self-pay | Admitting: *Deleted

## 2015-02-02 IMAGING — CT CT ABD-PELV W/ CM
2 of 4 series · 12 of 32 positions shown, 17 images · IV contrast (OMNIPAQUE)
Comparison: CT of the abdomen and pelvis 04/18/2010

CLINICAL DATA: Diarrhea yesterday with pelvic pressure. Vaginal
bleeding for 3 days. Amenorrhea for 2 years. Prolapsed organ today
with urinary retention. Severe hip arthritis. No previous hip
injury.

EXAM:
CT ABDOMEN AND PELVIS WITH CONTRAST
TECHNIQUE: Multidetector CT imaging of the abdomen and pelvis was performed
using the standard protocol following bolus administration of
intravenous contrast.
CONTRAST:  100mL OMNIPAQUE IOHEXOL 300 MG/ML  SOLN

[Series 3: routine abdomen/pelvis with · axial · 0.98mm/px · z∈[-555,-210]mm · 4 of 116 slices shown, 9 images]
[im 24/116  soft-tissue]
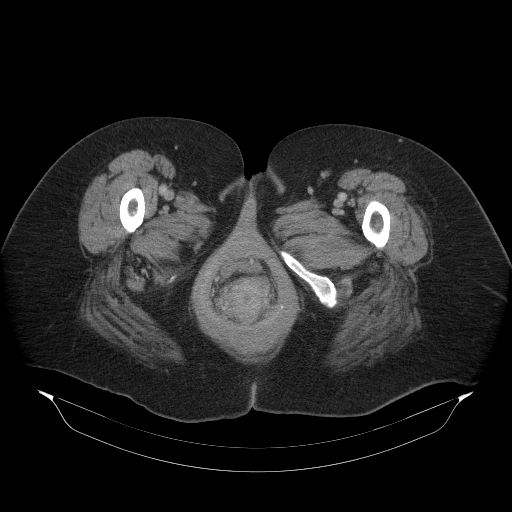
[im 24/116  lung]
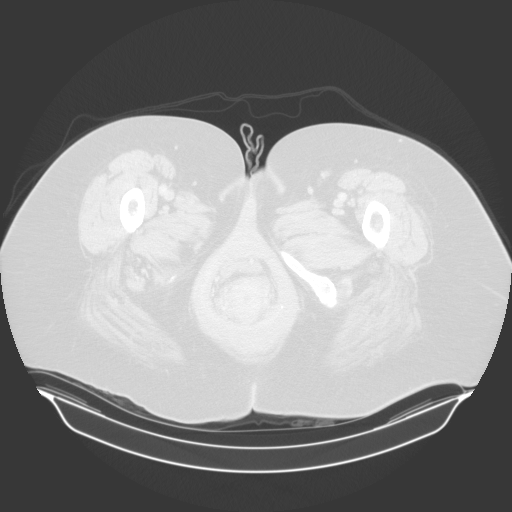
[im 24/116  bone]
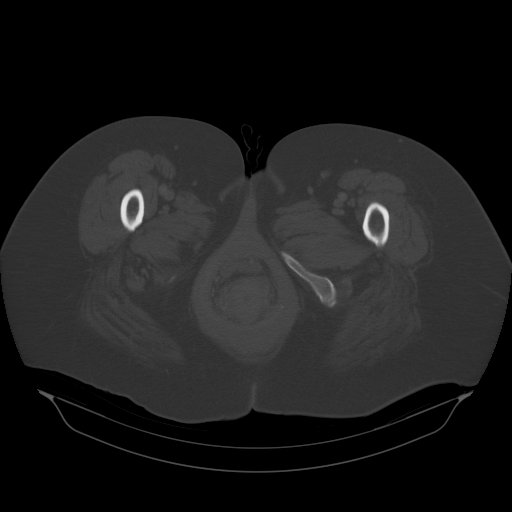
[im 47/116  soft-tissue]
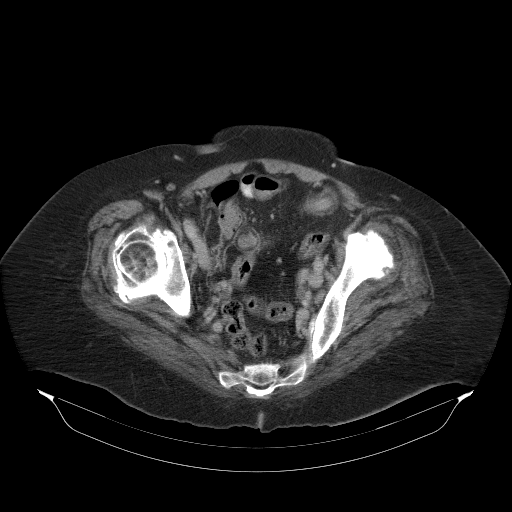
[im 47/116  lung]
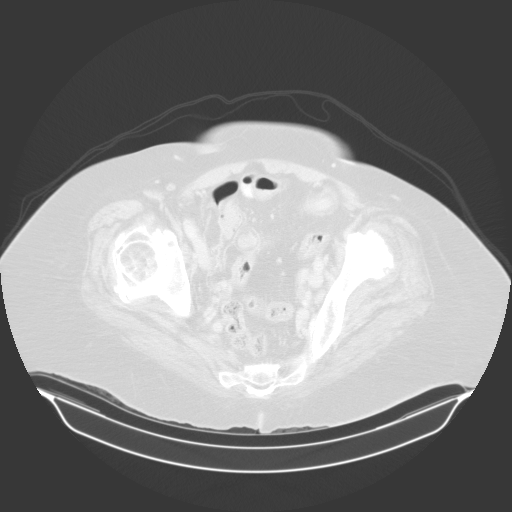
[im 70/116  soft-tissue]
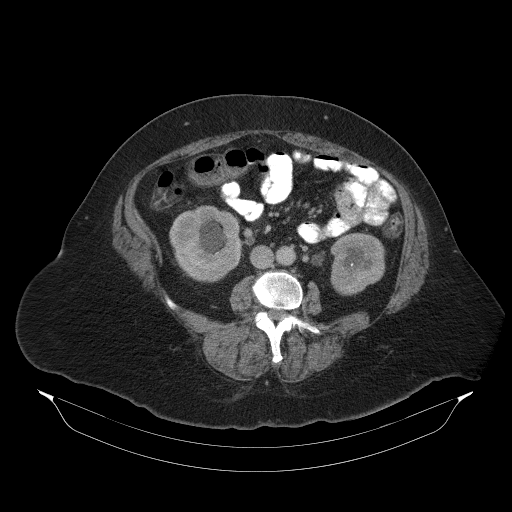
[im 70/116  lung]
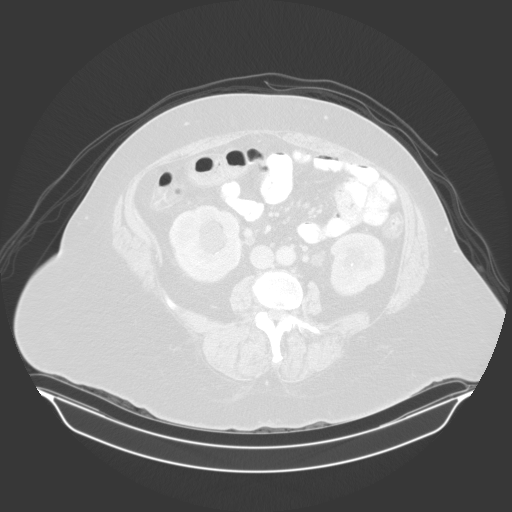
[im 93/116  soft-tissue]
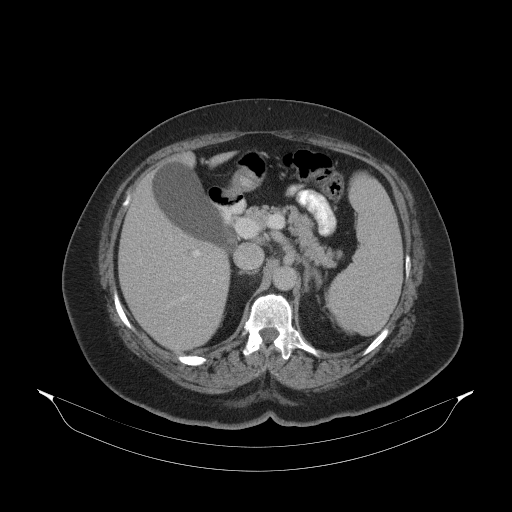
[im 93/116  lung]
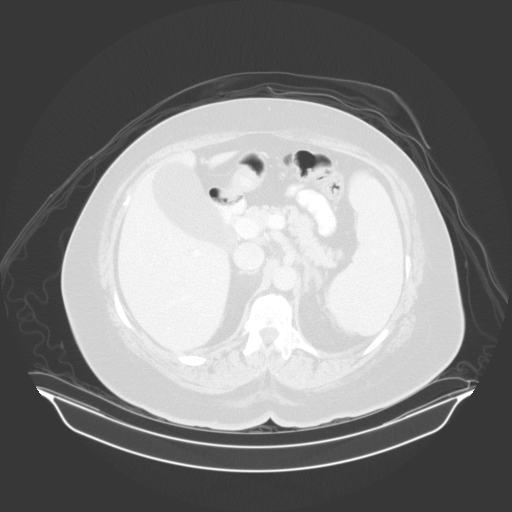

[Series 8: delays · axial · 0.98mm/px · z∈[-651,-430]mm · 8 of 396 slices shown]
[im 40/396  soft-tissue]
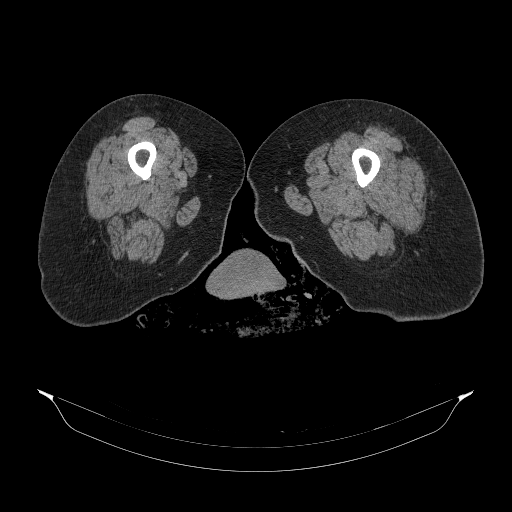
[im 80/396  soft-tissue]
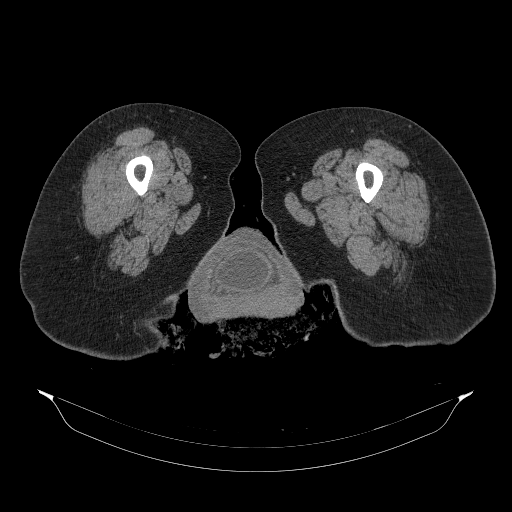
[im 119/396  soft-tissue]
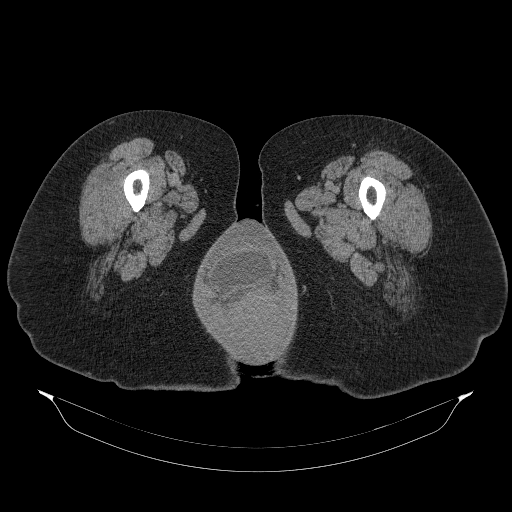
[im 178/396  soft-tissue]
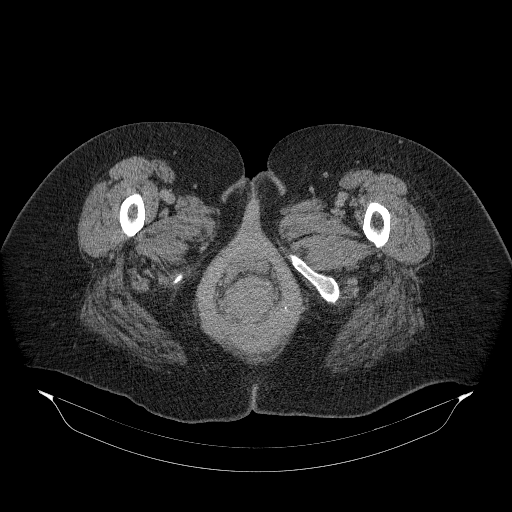
[im 218/396  soft-tissue]
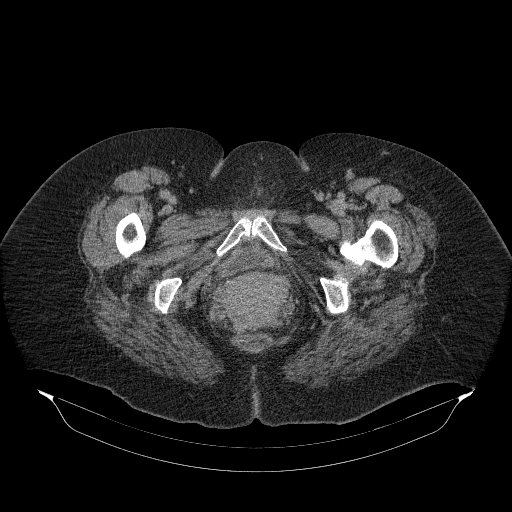
[im 277/396  soft-tissue]
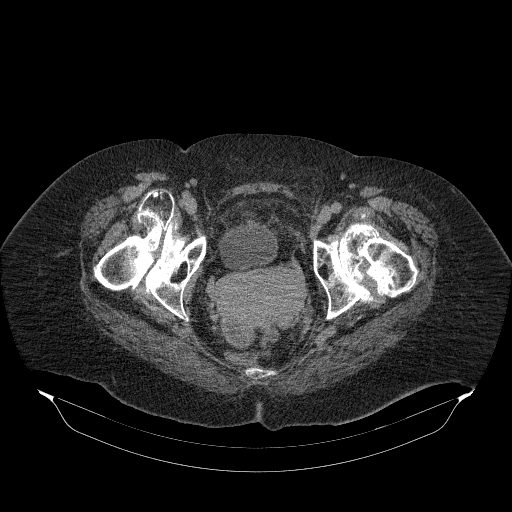
[im 317/396  soft-tissue]
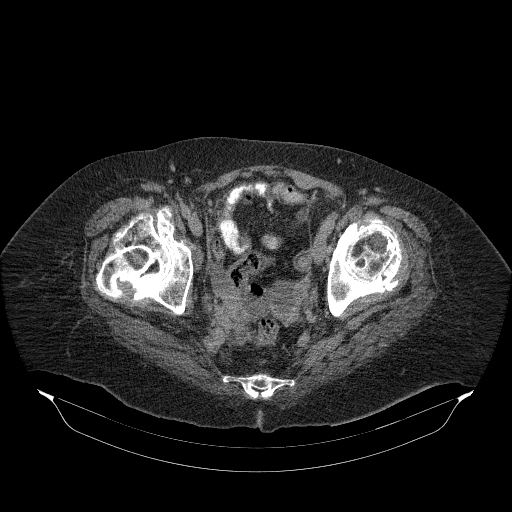
[im 356/396  soft-tissue]
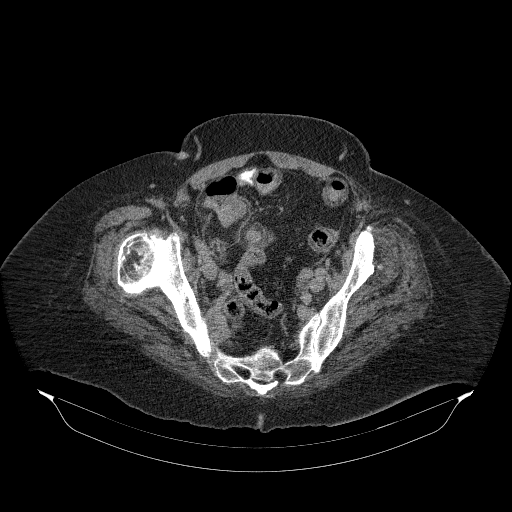

[12 of 32 positions shown; findings below may reference images not displayed]

FINDINGS: Lung bases are negative. The gallbladder is present and contains
layering gallstone or gallstones. No pericholecystic fluid.

No focal abnormality identified within the liver, spleen, pancreas,
adrenal glands.

There is marked bilateral hydronephrosis and hydroureter. Bilateral
renal cysts are present. Probable small 1-2 mm intrarenal calculus
is identified in the lower pole of the left kidney. Even on 20
minute delayed images, there is no contrast within the distal
ureters or bladder. The findings may be related to hydronephrosis
but do raise the question of compromised renal function.

There is prolapse of the uterus, urinary bladder, rectum. The
prolapse of the bladder in falls prolapse of both ureters and as
result bilateral hydroureter and hydronephrosis.

The stomach and small bowel loops have a normal appearance. There is
fat deposition within the wall of the entire colon, consistent with
long-standing inflammation.

Both ovaries are visualized. Left ovary is 3.4 cm. Right ovary is
3.5 cm. No free pelvic fluid or pelvic adenopathy.

There is mild scoliosis of the lumbar spine. Degenerative changes
are seen at L2-3. There is chronic bilateral developmental dysplasia
of the hips with marked degenerative changes and heterotopic bone.
IMPRESSION: 1. Prolapse of the uterus, urinary bladder/ureters, and rectum.
2. Marked bilateral hydronephrosis and delayed excretion raising the
question of compromised renal function as a result of the bilateral
obstruction.
3. Cholelithiasis.
4. Bilateral renal cysts.
5. Longstanding developmental hip dysplasia bilaterally associated
with severe osteoarthritis and lumbar scoliosis.
6. Changes consistent with chronic inflammation of the colon.
7. The findings are discussed with Dr. Gotay.

## 2019-06-25 ENCOUNTER — Other Ambulatory Visit: Payer: Self-pay

## 2019-06-25 ENCOUNTER — Ambulatory Visit: Payer: Medicare Other | Attending: Family Medicine | Admitting: Family Medicine

## 2019-07-09 ENCOUNTER — Ambulatory Visit: Payer: Medicare Other | Admitting: Family Medicine

## 2019-08-15 ENCOUNTER — Other Ambulatory Visit: Payer: Self-pay

## 2019-08-15 ENCOUNTER — Ambulatory Visit: Payer: Medicare Other | Attending: Family Medicine | Admitting: Family Medicine

## 2019-08-15 ENCOUNTER — Encounter: Payer: Self-pay | Admitting: Gastroenterology

## 2019-08-15 ENCOUNTER — Encounter: Payer: Self-pay | Admitting: Family Medicine

## 2019-08-15 DIAGNOSIS — M162 Bilateral osteoarthritis resulting from hip dysplasia: Secondary | ICD-10-CM

## 2019-08-15 DIAGNOSIS — N3946 Mixed incontinence: Secondary | ICD-10-CM | POA: Diagnosis not present

## 2019-08-15 DIAGNOSIS — M419 Scoliosis, unspecified: Secondary | ICD-10-CM

## 2019-08-15 DIAGNOSIS — Z800 Family history of malignant neoplasm of digestive organs: Secondary | ICD-10-CM

## 2019-08-15 DIAGNOSIS — N8110 Cystocele, unspecified: Secondary | ICD-10-CM

## 2019-08-15 DIAGNOSIS — Z1211 Encounter for screening for malignant neoplasm of colon: Secondary | ICD-10-CM

## 2019-08-15 NOTE — Progress Notes (Signed)
New patient telephone visit to establish care Subjective:  Patient ID: Mariah Morrison, female    DOB: 05/07/1961  Age: 58 y.o. MRN: [id number]  CC: Establish Care, Hand Pain, Generalized Body Aches, and URINARY ISSUES  Patient location: Home Provider location: [facility] Others involved in today's call: Call was initiated by Eustaquio Boydenctavia Richards, RMA who then transferred the call  I connected with Mariah Morrison on 08/15/2019 at 10:35 AM EST by telephone and verified that I was speaking with the correct person using 2 identifiers.  I discussed the limitations, risk, security and privacy concerns of performing evaluation and management service by telephone and the availability of-depression appointments.  I also discussed that there will be a patient responsible chart related to this service.  The patient expressed understanding and agreed to proceed with today's telemedicine visit.   HPI Mariah BlazerRaissa Morrison presents to establish care and she has complaint of severe arthritis. She reports some numbness in her hands due to the humidity. She states that she was diagnosed with osteoarthritis since age 58. She reports a hip deformity at birth and she now has back and knee pain as well.  She has also developed some issues with bladder leakage.  Patient has bladder leakage with coughing, laughing or sneezing.  She has a history of an abdominal hysterectomy secondary to prolapse of the uterus.  Patient has been told in the past that she also has some bladder prolapse.  Patient currently only takes Advil/ibuprofen over-the-counter to help with her joint pain.  Patient states that she does have to use a walker for ambulation.  Patient states that her hip pain is a 10 on a 0-to-10 scale.  Pain can be sharp and aching.  Patient denies any upper abdominal pain, no burping/belching or burning in her upper stomach or substernally due to use of Advil.  She denies any other abdominal  pain.  No dysuria.  She has had no blood in the stool and has had no black stools.  She does have family history of father with colon cancer who passed away at the age of 58.  On further review of systems she has had no fever or chills, no sore throat or difficulty swallowing, no visual disturbances on no increased thirst.  No shortness of breath or cough.  She does have fatigue related to chronic pain.  She has had past kidney stones but no current issues with flank pain, no hematuria.  She does have chronic low back pain.  Past Medical History:  Diagnosis Date  . Arthritis    severe  . Kidney calculi     Past Surgical History:  Procedure Laterality Date  . ABDOMINAL HYSTERECTOMY      Family History  Problem Relation Age of Onset  . Cancer Father   . Heart disease Father     Social History   Tobacco Use  . Smoking status: Never Smoker  . Smokeless tobacco: Never Used  Substance Use Topics  . Alcohol use: No       No vital signs or physical examination done at today's visit which was conducted by telephone   Assessment & Plan:  1. 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  On review of chart, patient had CT scan done in 2015 in follow-up of urinary retention and uterine and bladder prolapse.  The CT scan showed mild scoliosis of the lumbar spine along with degenerative changes at L2-3.  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  Patient is being referred to orthopedics for further evaluation and treatment. -Ambulatory referral to Orthopedics  2. Mixed stress and urge urinary incontinence; 3. Acquired female bladder prolapse Patient has had prior CT scan in 2015 showing  prolapse of the bladder and uterus and patient is status post hysterectomy and continues to have issues with mixed stress and urge urinary incontinence.  Referral is being made for patient to see a uro-gynecologist for further evaluation and treatment. -Ambulatory referral to Uro-gynecology  4. Family history of colon cancer in father; 58. Screening for colon cancer Patient with family history of colon cancer in her father who passed with the age of 18.  Patient will be referred to gastroenterology for screening colonoscopy. -Ambulatory referral to Gastroenterology  Outpatient Encounter Medications as of 08/15/2019  Medication Sig  . ferrous sulfate (FERROUSUL) 325 (65 FE) MG tablet Take 1 tablet (325 mg total) by mouth 2 (two) times daily.  Marland Kitchen ibuprofen (ADVIL) 200 MG tablet Take 400 mg by mouth every 6 (six) hours as needed for moderate pain.   . promethazine (PHENERGAN) 25 MG tablet Take 1 tablet (25 mg total) by mouth every 6 (six) hours as needed for nausea or vomiting.  . docusate sodium (COLACE) 100 MG capsule Take 1 capsule (100 mg total) by mouth 2 (two) times daily as needed. (Patient not taking: Reported on 08/15/2019)  . megestrol (MEGACE) 20 MG tablet Take 2 tablets (40 mg total) by mouth 2 (two) times daily. (Patient not taking: Reported on 08/15/2019)  . oxyCODONE-acetaminophen (PERCOCET/ROXICET) 5-325 MG per tablet Take 1-2 tablets by mouth every 6 (six) hours as needed. (Patient not taking: Reported on 08/15/2019)   No facility-administered encounter medications on file as of 08/15/2019.    - Patient was provided an opportunity to ask questions and all questions were answered to patient satisfaction.  Patient agrees to today's plan and demonstrated an understanding of instructions. - Patient was advised to call back or seek an-person evaluation if symptoms worsen or if the condition fails to improve and to seek immediate medical attention for any acute worsening of her symptoms.   Follow-up: Return in about 6 weeks (around 09/26/2019) for follow-up of chronic issues.   Montie Gelardi MD  I spent 16 minutes of non-face-to-face encounter time with the patient at today's visit.

## 2019-08-22 ENCOUNTER — Ambulatory Visit (AMBULATORY_SURGERY_CENTER): Payer: Self-pay | Admitting: *Deleted

## 2019-08-22 ENCOUNTER — Other Ambulatory Visit: Payer: Self-pay

## 2019-08-22 VITALS — Temp 97.1°F | Ht 65.0 in | Wt 265.0 lb

## 2019-08-22 DIAGNOSIS — Z1211 Encounter for screening for malignant neoplasm of colon: Secondary | ICD-10-CM

## 2019-08-22 DIAGNOSIS — Z800 Family history of malignant neoplasm of digestive organs: Secondary | ICD-10-CM

## 2019-08-22 MED ORDER — NA SULFATE-K SULFATE-MG SULF 17.5-3.13-1.6 GM/177ML PO SOLN
ORAL | 0 refills | Status: DC
Start: 2019-08-22 — End: 2019-09-05

## 2019-08-22 NOTE — Progress Notes (Signed)
Patient is here in person for PV via wheelchair. Patient is able to walk and stand, she did get on scales today for weight. Patient denies any allergies to eggs or soy. Patient denies any problems with anesthesia/sedation. Patient denies any oxygen use at home. Patient denies taking any diet/weight loss medications or blood thinners. EMMI education declined, colon handout given to pt. Pt is aware that care partner will wait in the car during procedure; if they feel like they will be too hot to wait in the car; they may wait in the lobby.  We want them to wear a mask (we do not have any that we can provide them), practice social distancing, and we will check their temperatures when they get here.  I did remind patient that their care partner needs to stay in the parking lot the entire time. Pt will wear mask into building.

## 2019-09-04 ENCOUNTER — Telehealth: Payer: Self-pay

## 2019-09-04 NOTE — Telephone Encounter (Signed)
Covid-19 screening questions   Do you now or have you had a fever in the last 14 days? NO   Do you have any respiratory symptoms of shortness of breath or cough now or in the last 14 days? NO  Do you have any family members or close contacts with diagnosed or suspected Covid-19 in the past 14 days? NO  Have you been tested for Covid-19 and found to be positive? NO        

## 2019-09-05 ENCOUNTER — Encounter: Payer: Self-pay | Admitting: Gastroenterology

## 2019-09-05 ENCOUNTER — Ambulatory Visit (AMBULATORY_SURGERY_CENTER): Payer: Medicare Other | Admitting: Gastroenterology

## 2019-09-05 ENCOUNTER — Other Ambulatory Visit: Payer: Self-pay

## 2019-09-05 VITALS — BP 128/80 | HR 91 | Temp 98.4°F | Resp 14 | Ht 65.0 in | Wt 265.0 lb

## 2019-09-05 DIAGNOSIS — Z1211 Encounter for screening for malignant neoplasm of colon: Secondary | ICD-10-CM | POA: Diagnosis not present

## 2019-09-05 DIAGNOSIS — Z800 Family history of malignant neoplasm of digestive organs: Secondary | ICD-10-CM | POA: Diagnosis not present

## 2019-09-05 MED ORDER — SODIUM CHLORIDE 0.9 % IV SOLN: 500.0000 mL | Freq: Once | INTRAVENOUS | Status: DC

## 2019-09-05 NOTE — Op Note (Addendum)
Mariah Morrison Patient Name: Mariah CalkinsRaissa Morrison Procedure Date: 09/05/2019 11:10 AM MRN: [id number] Endoscopist: Napoleon FormKavitha V. Nandigam , MD Age: 58 Referring MD:  Date of Birth: 11/19/1961 Gender: Female Account #: [id number] Procedure:                Colonoscopy Indications:              Screening patient at increased risk: Family history                            of 1st-degree relative with colorectal cancer at                            age 58 years (or older) Medicines:                Monitored Anesthesia Care Procedure:                Pre-Anesthesia Assessment:                           - Prior to the procedure, a History and Physical                            was performed, and patient medications and                            allergies were reviewed. The patient's tolerance of                            previous anesthesia was also reviewed. The risks                            and benefits of the procedure and the sedation                            options and risks were discussed with the patient.                            All questions were answered, and informed consent                            was obtained. Prior Anticoagulants: The patient has                            taken no previous anticoagulant or antiplatelet                            agents. ASA Grade Assessment: II - A patient with                            mild systemic disease. After reviewing the risks                            and benefits, the patient was deemed in  satisfactory condition to undergo the procedure.                           After obtaining informed consent, the colonoscope                            was passed under direct vision. Throughout the                            procedure, the patient's blood pressure, pulse, and                            oxygen saturations were monitored continuously. The                            Colonoscope was introduced  through the anus and                            advanced to the the cecum, identified by                            appendiceal orifice and ileocecal valve. The                            colonoscopy was performed without difficulty. The                            patient tolerated the procedure well. The quality                            of the bowel preparation was excellent. The                            ileocecal valve, appendiceal orifice, and rectum                            were photographed. Scope In: 11:24:17 AM Scope Out: 11:42:03 AM Scope Withdrawal Time: 0 hours 10 minutes 56 seconds  Total Procedure Duration: 0 hours 17 minutes 46 seconds  Findings:                 The perianal and digital rectal examinations were                            normal.                           A few small-mouthed diverticula were found in the                            sigmoid colon.                           Non-bleeding internal hemorrhoids were found during  retroflexion. The hemorrhoids were small.                           The exam was otherwise without abnormality. Complications:            No immediate complications. Estimated Blood Loss:     Estimated blood loss: none. Impression:               - Diverticulosis in the sigmoid colon.                           - Non-bleeding internal hemorrhoids.                           - The examination was otherwise normal.                           - No specimens collected. Recommendation:           - Patient has a contact number available for                            emergencies. The signs and symptoms of potential                            delayed complications were discussed with the                            patient. Return to normal activities tomorrow.                            Written discharge instructions were provided to the                            patient.                           - Resume previous  diet.                           - Continue present medications.                           - Repeat colonoscopy in 5 years for screening                            purposes. Napoleon Form, MD 09/05/2019 11:46:09 AM This report has been signed electronically.

## 2019-09-05 NOTE — Progress Notes (Signed)
PT taken to PACU. Monitors in place. VSS. Report given to RN. 

## 2019-09-05 NOTE — Patient Instructions (Signed)
Please read handouts provided. Continue present medications.     YOU HAD AN ENDOSCOPIC PROCEDURE TODAY AT THE [facility]:   Refer to the procedure report that was given to you for any specific questions about what was found during the examination.  If the procedure report does not answer your questions, please call your gastroenterologist to clarify.  If you requested that your care partner not be given the details of your procedure findings, then the procedure report has been included in a sealed envelope for you to review at your convenience later.  YOU SHOULD EXPECT: Some feelings of bloating in the abdomen. Passage of more gas than usual.  Walking can help get rid of the air that was put into your GI tract during the procedure and reduce the bloating. If you had a lower endoscopy (such as a colonoscopy or flexible sigmoidoscopy) you may notice spotting of blood in your stool or on the toilet paper. If you underwent a bowel prep for your procedure, you may not have a normal bowel movement for a few days.  Please Note:  You might notice some irritation and congestion in your nose or some drainage.  This is from the oxygen used during your procedure.  There is no need for concern and it should clear up in a day or so.  SYMPTOMS TO REPORT IMMEDIATELY:   Following lower endoscopy (colonoscopy or flexible sigmoidoscopy):  Excessive amounts of blood in the stool  Significant tenderness or worsening of abdominal pains  Swelling of the abdomen that is new, acute  Fever of 100F or higher   For urgent or emergent issues, a gastroenterologist can be reached at any hour by calling [phone].   DIET:  We do recommend a small meal at first, but then you may proceed to your regular diet.  Drink plenty of fluids but you should avoid alcoholic beverages for 24 hours.  ACTIVITY:  You should plan to take it easy for the rest of today and you should NOT DRIVE or use heavy machinery  until tomorrow (because of the sedation medicines used during the test).    FOLLOW UP: Our staff will call the number listed on your records 48-72 hours following your procedure to check on you and address any questions or concerns that you may have regarding the information given to you following your procedure. If we do not reach you, we will leave a message.  We will attempt to reach you two times.  During this call, we will ask if you have developed any symptoms of COVID 19. If you develop any symptoms (ie: fever, flu-like symptoms, shortness of breath, cough etc.) before then, please call [phone].  If you test positive for Covid 19 in the 2 weeks post procedure, please call and report this information to us.    If any biopsies were taken you will be contacted by phone or by letter within the next 1-3 weeks.  Please call us at [phone] if you have not heard about the biopsies in 3 weeks.    SIGNATURES/CONFIDENTIALITY: You and/or your care partner have signed paperwork which will be entered into your electronic medical record.  These signatures attest to the fact that that the information above on your After Visit Summary has been reviewed and is understood.  Full responsibility of the confidentiality of this discharge information lies with you and/or your care-partner. 

## 2019-09-05 NOTE — Progress Notes (Signed)
Pt's states no medical or surgical changes since previsit or office visit.  CW vitals JB temps and Mo IV

## 2019-09-07 ENCOUNTER — Telehealth: Payer: Self-pay | Admitting: *Deleted

## 2019-09-07 NOTE — Telephone Encounter (Signed)
No answer for post procedure follow up call left message and will call back later this afternoon. SM 

## 2019-09-07 NOTE — Telephone Encounter (Signed)
YOU HAD AN ENDOSCOPIC PROCEDURE TODAY AT [facility]:   Refer to the procedure report that was given to you for any specific questions about what was found during the examination.  If the procedure report does not answer your questions, please call your gastroenterologist to clarify.  If you requested that your care partner not be given the details of your procedure findings, then the procedure report has been included in a sealed envelope for you to review at your convenience later.  YOU SHOULD EXPECT: Some feelings of bloating in the abdomen. Passage of more gas than usual.  Walking can help get rid of the air that was put into your GI tract during the procedure and reduce the bloating. If you had a lower endoscopy (such as a colonoscopy or flexible sigmoidoscopy) you may notice spotting of blood in your stool or on the toilet paper. If you underwent a bowel prep for your procedure, you may not have a normal bowel movement for a few days.  Please Note:  You might notice some irritation and congestion in your nose or some drainage.  This is from the oxygen used during your procedure.  There is no need for concern and it should clear up in a day or so.  SYMPTOMS TO REPORT IMMEDIATELY:   Following lower endoscopy (colonoscopy or flexible sigmoidoscopy):  Excessive amounts of blood in the stool  Significant tenderness or worsening of abdominal pains  Swelling of the abdomen that is new, acute  Fever of 100F or higher   Following upper endoscopy (EGD)  Vomiting of blood or coffee ground material  New chest pain or pain under the shoulder blades  Painful or persistently difficult swallowing  New shortness of breath  Fever of 100F or higher  Black, tarry-looking stools  For urgent or emergent issues, a gastroenterologist can be reached at any hour by calling [phone].   DIET:  We do recommend a small meal at first, but then you may proceed to your regular diet.  Drink  plenty of fluids but you should avoid alcoholic beverages for 24 hours.  ACTIVITY:  You should plan to take it easy for the rest of today and you should NOT DRIVE or use heavy machinery until tomorrow (because of the sedation medicines used during the test).    FOLLOW UP: Our staff will call the number listed on your records 48-72 hours following your procedure to check on you and address any questions or concerns that you may have regarding the information given to you following your procedure. If we do not reach you, we will leave a message.  We will attempt to reach you two times.  During this call, we will ask if you have developed any symptoms of COVID 19. If you develop any symptoms (ie: fever, flu-like symptoms, shortness of breath, cough etc.) before then, please call [phone].  If you test positive for Covid 19 in the 2 weeks post procedure, please call and report this information to [place].    If any biopsies were taken you will be contacted by phone or by letter within the next 1-3 weeks.  Please call us at [phone] if you have not heard about the biopsies in 3 weeks.    1. Have you developed a fever since your procedure? no  2.   Have you had an respiratory symptoms (SOB or cough) since your procedure? no  3.   Have you tested positive for COVID 19 since your  procedure no  4.   Have you had any family members/close contacts diagnosed with the COVID 19 since your procedure?  n0   If yes to any of these questions please route to Joylene John, RN and Alphonsa Gin, Therapist, sports.     Follow up Call-  Call back number 09/05/2019  Post procedure Call Back phone  # [phone]  Permission to leave phone message Yes  Some recent data might be hidden     Patient questions:  Do you have a fever, pain , or abdominal swelling? No. Pain Score  0 *  Have you tolerated food without any problems? Yes.    Have you been able to return to your normal activities? Yes.    Do you have  any questions about your discharge instructions: Diet   No. Medications  No. Follow up visit  No.  Do you have questions or concerns about your Care? No.  Actions: * If pain score is 4 or above: No action needed, pain <4.

## 2019-09-10 ENCOUNTER — Ambulatory Visit: Payer: Medicare Other | Admitting: Orthopaedic Surgery

## 2019-09-12 ENCOUNTER — Ambulatory Visit: Payer: Medicare Other | Admitting: Orthopaedic Surgery

## 2019-09-19 ENCOUNTER — Ambulatory Visit (INDEPENDENT_AMBULATORY_CARE_PROVIDER_SITE_OTHER): Payer: Medicare Other

## 2019-09-19 ENCOUNTER — Ambulatory Visit (INDEPENDENT_AMBULATORY_CARE_PROVIDER_SITE_OTHER): Payer: Medicare Other | Admitting: Orthopaedic Surgery

## 2019-09-19 VITALS — Ht 65.0 in | Wt 265.0 lb

## 2019-09-19 DIAGNOSIS — M1611 Unilateral primary osteoarthritis, right hip: Secondary | ICD-10-CM

## 2019-09-19 DIAGNOSIS — M25552 Pain in left hip: Secondary | ICD-10-CM

## 2019-09-19 DIAGNOSIS — R200 Anesthesia of skin: Secondary | ICD-10-CM | POA: Diagnosis not present

## 2019-09-19 DIAGNOSIS — M1612 Unilateral primary osteoarthritis, left hip: Secondary | ICD-10-CM

## 2019-09-19 DIAGNOSIS — M25551 Pain in right hip: Secondary | ICD-10-CM

## 2019-09-19 MED ORDER — GABAPENTIN 100 MG PO CAPS: 100.0000 mg | ORAL_CAPSULE | Freq: Three times a day (TID) | ORAL | 1 refills | Status: DC

## 2019-09-19 MED ORDER — NABUMETONE 750 MG PO TABS: 750.0000 mg | ORAL_TABLET | Freq: Two times a day (BID) | ORAL | 1 refills | Status: DC | PRN

## 2019-09-19 NOTE — Progress Notes (Signed)
Office Visit Note   Patient: Mariah Morrison           Date of Birth: 06/09/1961           MRN: [id number] Visit Date: 09/19/2019              Requested by: Cain Saupe, MD [address] [place],  [place] [address] PCP: Cain Saupe, MD   Assessment & Plan: Visit Diagnoses:  1. Bilateral hip pain   2. Unilateral primary osteoarthritis, right hip   3. Unilateral primary osteoarthritis, left hip   4. Bilateral hand numbness     Plan: We talked in detail about her hips.  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  This is a significant situation in terms of deformity of both hips and is not something that I would be comfortable with performing hip replacement surgery on nor feel that I have the experience for this based on the severity of her deformity.  Her weight also plays into making surgery quite difficult.  I feel that a referral to an [facility] and joint reconstruction specialist may be worthwhile but I did let her know that there is a good chance that no one would not perform any type of surgery on her due to the difficult nature of being able to replace her hips and put them in a good position that states them in the pelvis well and can be stable.  Her situation be right with complications.  Since she is not any type of medications other than over-the-counter anti-inflammatories I will place her on Relafen twice a day as well as 100 mg of Neurontin up to 3 times a day to see how this helps with her pain and nerve pathways.  We will also obtain nerve conduction studies of her bilateral upper extremities to rule out carpal tunnel syndrome.  We will see her back in 4 weeks.  Will consider chronic pain management as well.  All question concerns were answered and addressed.  Follow-Up Instructions: Return in about 4 weeks (around 10/17/2019).   Orders:  Orders Placed This Encounter  Procedures  . XR HIPS BILAT W OR W/O PELVIS 2V   Meds ordered this encounter   Medications  . nabumetone (RELAFEN) 750 MG tablet    Sig: Take 1 tablet (750 mg total) by mouth 2 (two) times daily as needed.    Dispense:  60 tablet    Refill:  1  . gabapentin (NEURONTIN) 100 MG capsule    Sig: Take 1 capsule (100 mg total) by mouth 3 (three) times daily.    Dispense:  60 capsule    Refill:  1      Procedures: No procedures performed   Clinical Data: No additional findings.   Subjective: Chief Complaint  Patient presents with  . Left Hip - Pain  . Right Hip - Pain  The patient is a very pleasant 58 year old female that I am seeing for the first time.  She comes for evaluation treatment of bilateral hip pain is been chronic.  Her hips have been hurting since she was very young.  She can even remember being age 44 with both her hips hurting.  She says she walks with a waddling type of gait and does use a walking stick.  Her pain is daily and is detrimentally affecting her mobility, her quality of life and her actives daily living.  She also reports bilateral hand numbness.  The hand numbness does not  wake her up at night but both hands do get numb.  She is not a diabetic.  She does have a BMI of 44.1.  There is actually a CT scan of her pelvis from 2015 that do show her hips that I can review and review of plain films today.  HPI  Review of Systems She currently denies any headache, chest pain, shortness of breath, fever, chills, nausea, vomiting  Objective: Vital Signs: Ht 5\' 5"  (1.651 m)   Wt 265 lb (120.2 kg)   LMP 01/13/2014 Comment: very irregular  BMI 44.10 kg/m   Physical Exam She is alert and orient x3 and in no acute distress Ortho Exam Examination of both her hips show limited range of motion of both hips with pain all around both hips with attempts of motion.  Her leg lengths are equal.  Both her hips and legs in terms of her soft tissue envelope were large.  Both hands show slight muscle atrophy and seemed having some global numbness and  tingling in her Tinel's and Phalen's exams are otherwise equivocal. Specialty Comments:  No specialty comments available.  Imaging: Xr Hips Bilat W Or W/o Pelvis 2v  Result Date: 09/19/2019 An AP pelvis and attempted lateral of both hips were quite difficult to see due to her soft tissue.  What can be seen is severe deformities of both hips.  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  There is severe arthritic changes with both hips high riding in the pelvis.  A CT scan from 2015 is reviewed her I can see both hips.  Even then he could see the severity of the condition deformity and arthritic changes in both hips.  PMFS History: Patient Active Problem List   Diagnosis Date Noted  . Uterine prolapse 05/13/2014  . Prolapse of female pelvic organs 05/13/2014   Past Medical History:  Diagnosis Date  . Arthritis    severe  . Kidney calculi     Family History  Problem Relation Age of Onset  . Cancer Father 1  . Heart disease Father   . Colon cancer Father 80  . Diabetes Neg Hx   . Esophageal cancer Neg Hx   . Rectal cancer Neg Hx   . Stomach cancer Neg Hx   . Colon polyps Neg Hx     Past Surgical History:  Procedure Laterality Date  . ABDOMINAL HYSTERECTOMY     Social History   Occupational History  . Not on file  Tobacco Use  . Smoking status: Never Smoker  . Smokeless tobacco: Never Used  Substance and Sexual Activity  . Alcohol use: No  . Drug use: No  . Sexual activity: Not Currently    Comment: not in past ten years

## 2019-09-20 ENCOUNTER — Other Ambulatory Visit: Payer: Self-pay

## 2019-09-20 DIAGNOSIS — M79641 Pain in right hand: Secondary | ICD-10-CM

## 2019-10-17 ENCOUNTER — Ambulatory Visit: Payer: Medicare Other | Admitting: Orthopaedic Surgery

## 2019-10-18 ENCOUNTER — Encounter: Payer: Self-pay | Admitting: Physical Medicine and Rehabilitation

## 2019-10-18 ENCOUNTER — Ambulatory Visit (INDEPENDENT_AMBULATORY_CARE_PROVIDER_SITE_OTHER): Payer: Medicare Other | Admitting: Physical Medicine and Rehabilitation

## 2019-10-18 ENCOUNTER — Other Ambulatory Visit: Payer: Self-pay

## 2019-10-18 DIAGNOSIS — R202 Paresthesia of skin: Secondary | ICD-10-CM | POA: Diagnosis not present

## 2019-10-18 NOTE — Progress Notes (Signed)
 .  Numeric Pain Rating Scale and Functional Assessment Average Pain 0   In the last MONTH (on 0-10 scale) has pain interfered with the following?  1. General activity like being  able to carry out your everyday physical activities such as walking, climbing stairs, carrying groceries, or moving a chair?  Rating(8)     

## 2019-10-23 NOTE — Procedures (Signed)
EMG & NCV Findings: Evaluation of the left median motor nerve showed prolonged distal onset latency (8.1 ms) and reduced amplitude (0.6 mV).  The right median motor nerve showed prolonged distal onset latency (11.9 ms), reduced amplitude (1.0 mV), and decreased conduction velocity (Elbow-Wrist, 26 m/s).  The left median (across palm) sensory nerve showed prolonged distal peak latency (Wrist, 13.2 ms), reduced amplitude (6.0 V), and prolonged distal peak latency (Palm, 10.9 ms).  The right median (across palm) sensory nerve showed no response (Palm), prolonged distal peak latency (8.4 ms), and reduced amplitude (4.8 V).  The left ulnar sensory and the right ulnar sensory nerves showed reduced amplitude (L14.8, R10.2 V).  All remaining nerves (as indicated in the following tables) were within normal limits.  Left vs. Right side comparison data for the median motor nerve indicates abnormal L-R latency difference (3.8 ms) and abnormal L-R velocity difference (Elbow-Wrist, 37 m/s).  The ulnar motor nerve indicates abnormal L-R amplitude difference (46.9 %).  All remaining left vs. right side differences were within normal limits.    Needle evaluation of the right abductor pollicis brevis muscle showed decreased insertional activity, increased spontaneous activity, and diminished recruitment.  The left abductor pollicis brevis muscle showed increased insertional activity, increased spontaneous activity, and diminished recruitment.  All remaining muscles (as indicated in the following table) showed no evidence of electrical instability.    Impression: The above electrodiagnostic study is ABNORMAL and reveals evidence of a severe BILATERAL median nerve entrapment at the wrist (carpal tunnel syndrome) affecting sensory and motor components. The lesion is characterized by sensory and motor demyelination with evidence of axonal injury.   There is no significant electrodiagnostic evidence of any other focal nerve  entrapment, brachial plexopathy or generalized peripheral neuropathy.   Recommendations: 1.  Follow-up with referring physician. 2.  Continue current management of symptoms. 3.  Suggest surgical evaluation.  ___________________________ Laurence Spates FAAPMR Board Certified, American Board of Physical Medicine and Rehabilitation    Nerve Conduction Studies Anti Sensory Summary Table   Stim Site NR Peak (ms) Norm Peak (ms) P-T Amp (V) Norm P-T Amp Site1 Site2 Delta-P (ms) Dist (cm) Vel (m/s) Norm Vel (m/s)  Left Median Acr Palm Anti Sensory (2nd Digit)  32.5C  Wrist    *13.2 <3.6 *6.0 >10 Wrist Palm 2.3 0.0    Palm    *10.9 <2.0 8.7         Right Median Acr Palm Anti Sensory (2nd Digit)  32.8C  Wrist    *8.4 <3.6 *4.8 >10 Wrist Palm  0.0    Palm *NR  <2.0          Left Radial Anti Sensory (Base 1st Digit)  32.4C  Wrist    1.9 <3.1 16.8  Wrist Base 1st Digit 1.9 0.0    Right Radial Anti Sensory (Base 1st Digit)  32.3C  Wrist    2.0 <3.1 19.6  Wrist Base 1st Digit 2.0 0.0    Left Ulnar Anti Sensory (5th Digit)  32.8C  Wrist    3.7 <3.7 *14.8 >15.0 Wrist 5th Digit 3.7 14.0 38 >38  Right Ulnar Anti Sensory (5th Digit)  32.7C  Wrist    3.4 <3.7 *10.2 >15.0 Wrist 5th Digit 3.4 14.0 41 >38   Motor Summary Table   Stim Site NR Onset (ms) Norm Onset (ms) O-P Amp (mV) Norm O-P Amp Site1 Site2 Delta-0 (ms) Dist (cm) Vel (m/s) Norm Vel (m/s)  Left Median Motor (Abd Poll Brev)  32.5C  Wrist    *  8.1 <4.2 *0.6 >5 Elbow Wrist 3.1 19.5 63 >50  Elbow    11.2  4.4         Right Median Motor (Abd Poll Brev)  32.1C  Wrist    *11.9 <4.2 *1.0 >5 Elbow Wrist 7.2 19.0 *26 >50  Elbow    19.1  0.5         Left Ulnar Motor (Abd Dig Min)  32.5C  Wrist    3.2 <4.2 3.4 >3 B Elbow Wrist 3.4 21.0 62 >53  B Elbow    6.6  2.9  A Elbow B Elbow 1.2 10.0 83 >53  A Elbow    7.8  3.1         Right Ulnar Motor (Abd Dig Min)  31.9C  Wrist    2.8 <4.2 6.4 >3 B Elbow Wrist 3.4 19.5 57 >53  B Elbow    6.2   3.0  A Elbow B Elbow 1.0 10.0 100 >53  A Elbow    7.2  5.8          EMG   Side Muscle Nerve Root Ins Act Fibs Psw Amp Dur Poly Recrt Int Dennie Bible Comment  Right Abd Poll Brev Median C8-T1 *Decr *3+ *3+ Nml Nml 0 *Reduced Nml   Right 1stDorInt Ulnar C8-T1 Nml Nml Nml Nml Nml 0 Nml Nml   Left Abd Poll Brev Median C8-T1 *Incr *3+ *3+ Nml Nml 0 *Reduced Nml   Left 1stDorInt Ulnar C8-T1 Nml Nml Nml Nml Nml 0 Nml Nml     Nerve Conduction Studies Anti Sensory Left/Right Comparison   Stim Site L Lat (ms) R Lat (ms) L-R Lat (ms) L Amp (V) R Amp (V) L-R Amp (%) Site1 Site2 L Vel (m/s) R Vel (m/s) L-R Vel (m/s)  Median Acr Palm Anti Sensory (2nd Digit)  32.5C  Wrist *13.2 *8.4 4.8 *6.0 *4.8 20.0 Wrist Palm     Palm *10.9   8.7         Radial Anti Sensory (Base 1st Digit)  32.4C  Wrist 1.9 2.0 0.1 16.8 19.6 14.3 Wrist Base 1st Digit     Ulnar Anti Sensory (5th Digit)  32.8C  Wrist 3.7 3.4 0.3 *14.8 *10.2 31.1 Wrist 5th Digit 38 41 3   Motor Left/Right Comparison   Stim Site L Lat (ms) R Lat (ms) L-R Lat (ms) L Amp (mV) R Amp (mV) L-R Amp (%) Site1 Site2 L Vel (m/s) R Vel (m/s) L-R Vel (m/s)  Median Motor (Abd Poll Brev)  32.5C  Wrist *8.1 *11.9 *3.8 *0.6 *1.0 40.0 Elbow Wrist 63 *26 *37  Elbow 11.2 19.1 7.9 4.4 0.5 88.6       Ulnar Motor (Abd Dig Min)  32.5C  Wrist 3.2 2.8 0.4 3.4 6.4 *46.9 B Elbow Wrist 62 57 5  B Elbow 6.6 6.2 0.4 2.9 3.0 3.3 A Elbow B Elbow 83 100 17  A Elbow 7.8 7.2 0.6 3.1 5.8 46.6          Waveforms:

## 2019-10-23 NOTE — Progress Notes (Signed)
Mariah Morrison - 58 y.o. female MRN [id number]  Date of birth: 30-May-1961  Office Visit Note: Visit Date: 10/18/2019 PCP: Antony Blackbird, MD Referred by: Antony Blackbird, MD  Subjective: Chief Complaint  Patient presents with  . Right Hand - Numbness  . Left Hand - Numbness   HPI: Mariah Morrison is a 58 y.o. female who comes in today At the request of Dr. Jean Rosenthal for electrodiagnostic study of both upper limbs.  Patient is right-hand dominant with chronic worsening numbness more than pain in the right and left hand globally.  She reports numbness of all fingers on both hands.  She reports years of progressive worsening.  She reports worsening with rain and trying to hold objects.  Ibuprofen helps with the numbness to a degree.  She has had no prior electrodiagnostic studies.  No complaints of radicular type pain down the arms.  No history of diabetes or thyroid disease.  ROS Otherwise per HPI.  Assessment & Plan: Visit Diagnoses:  1. Paresthesia of skin     Plan: Impression: The above electrodiagnostic study is ABNORMAL and reveals evidence of a severe BILATERAL median nerve entrapment at the wrist (carpal tunnel syndrome) affecting sensory and motor components. The lesion is characterized by sensory and motor demyelination with evidence of axonal injury.   There is no significant electrodiagnostic evidence of any other focal nerve entrapment, brachial plexopathy or generalized peripheral neuropathy.   Recommendations: 1.  Follow-up with referring physician. 2.  Continue current management of symptoms. 3.  Suggest surgical evaluation.  Meds & Orders: No orders of the defined types were placed in this encounter.   Orders Placed This Encounter  Procedures  . NCV with EMG (electromyography)    Follow-up: Return for Jean Rosenthal, MD.   Procedures: No procedures performed  EMG & NCV Findings: Evaluation of the left median motor nerve showed prolonged distal onset  latency (8.1 ms) and reduced amplitude (0.6 mV).  The right median motor nerve showed prolonged distal onset latency (11.9 ms), reduced amplitude (1.0 mV), and decreased conduction velocity (Elbow-Wrist, 26 m/s).  The left median (across palm) sensory nerve showed prolonged distal peak latency (Wrist, 13.2 ms), reduced amplitude (6.0 V), and prolonged distal peak latency (Palm, 10.9 ms).  The right median (across palm) sensory nerve showed no response (Palm), prolonged distal peak latency (8.4 ms), and reduced amplitude (4.8 V).  The left ulnar sensory and the right ulnar sensory nerves showed reduced amplitude (L14.8, R10.2 V).  All remaining nerves (as indicated in the following tables) were within normal limits.  Left vs. Right side comparison data for the median motor nerve indicates abnormal L-R latency difference (3.8 ms) and abnormal L-R velocity difference (Elbow-Wrist, 37 m/s).  The ulnar motor nerve indicates abnormal L-R amplitude difference (46.9 %).  All remaining left vs. right side differences were within normal limits.    Needle evaluation of the right abductor pollicis brevis muscle showed decreased insertional activity, increased spontaneous activity, and diminished recruitment.  The left abductor pollicis brevis muscle showed increased insertional activity, increased spontaneous activity, and diminished recruitment.  All remaining muscles (as indicated in the following table) showed no evidence of electrical instability.    Impression: The above electrodiagnostic study is ABNORMAL and reveals evidence of a severe BILATERAL median nerve entrapment at the wrist (carpal tunnel syndrome) affecting sensory and motor components. The lesion is characterized by sensory and motor demyelination with evidence of axonal injury.   There is no significant electrodiagnostic evidence of any  other focal nerve entrapment, brachial plexopathy or generalized peripheral neuropathy.   Recommendations: 1.   Follow-up with referring physician. 2.  Continue current management of symptoms. 3.  Suggest surgical evaluation.  ___________________________ Naaman PlummerFred Sakshi Sermons FAAPMR Board Certified, American Board of Physical Medicine and Rehabilitation    Nerve Conduction Studies Anti Sensory Summary Table   Stim Site NR Peak (ms) Norm Peak (ms) P-T Amp (V) Norm P-T Amp Site1 Site2 Delta-P (ms) Dist (cm) Vel (m/s) Norm Vel (m/s)  Left Median Acr Palm Anti Sensory (2nd Digit)  32.5C  Wrist    *13.2 <3.6 *6.0 >10 Wrist Palm 2.3 0.0    Palm    *10.9 <2.0 8.7         Right Median Acr Palm Anti Sensory (2nd Digit)  32.8C  Wrist    *8.4 <3.6 *4.8 >10 Wrist Palm  0.0    Palm *NR  <2.0          Left Radial Anti Sensory (Base 1st Digit)  32.4C  Wrist    1.9 <3.1 16.8  Wrist Base 1st Digit 1.9 0.0    Right Radial Anti Sensory (Base 1st Digit)  32.3C  Wrist    2.0 <3.1 19.6  Wrist Base 1st Digit 2.0 0.0    Left Ulnar Anti Sensory (5th Digit)  32.8C  Wrist    3.7 <3.7 *14.8 >15.0 Wrist 5th Digit 3.7 14.0 38 >38  Right Ulnar Anti Sensory (5th Digit)  32.7C  Wrist    3.4 <3.7 *10.2 >15.0 Wrist 5th Digit 3.4 14.0 41 >38   Motor Summary Table   Stim Site NR Onset (ms) Norm Onset (ms) O-P Amp (mV) Norm O-P Amp Site1 Site2 Delta-0 (ms) Dist (cm) Vel (m/s) Norm Vel (m/s)  Left Median Motor (Abd Poll Brev)  32.5C  Wrist    *8.1 <4.2 *0.6 >5 Elbow Wrist 3.1 19.5 63 >50  Elbow    11.2  4.4         Right Median Motor (Abd Poll Brev)  32.1C  Wrist    *11.9 <4.2 *1.0 >5 Elbow Wrist 7.2 19.0 *26 >50  Elbow    19.1  0.5         Left Ulnar Motor (Abd Dig Min)  32.5C  Wrist    3.2 <4.2 3.4 >3 B Elbow Wrist 3.4 21.0 62 >53  B Elbow    6.6  2.9  A Elbow B Elbow 1.2 10.0 83 >53  A Elbow    7.8  3.1         Right Ulnar Motor (Abd Dig Min)  31.9C  Wrist    2.8 <4.2 6.4 >3 B Elbow Wrist 3.4 19.5 57 >53  B Elbow    6.2  3.0  A Elbow B Elbow 1.0 10.0 100 >53  A Elbow    7.2  5.8          EMG   Side Muscle Nerve  Root Ins Act Fibs Psw Amp Dur Poly Recrt Int Dennie BiblePat Comment  Right Abd Poll Brev Median C8-T1 *Decr *3+ *3+ Nml Nml 0 *Reduced Nml   Right 1stDorInt Ulnar C8-T1 Nml Nml Nml Nml Nml 0 Nml Nml   Left Abd Poll Brev Median C8-T1 *Incr *3+ *3+ Nml Nml 0 *Reduced Nml   Left 1stDorInt Ulnar C8-T1 Nml Nml Nml Nml Nml 0 Nml Nml     Nerve Conduction Studies Anti Sensory Left/Right Comparison   Stim Site L Lat (ms) R Lat (ms) L-R Lat (ms) L  Amp (V) R Amp (V) L-R Amp (%) Site1 Site2 L Vel (m/s) R Vel (m/s) L-R Vel (m/s)  Median Acr Palm Anti Sensory (2nd Digit)  32.5C  Wrist *13.2 *8.4 4.8 *6.0 *4.8 20.0 Wrist Palm     Palm *10.9   8.7         Radial Anti Sensory (Base 1st Digit)  32.4C  Wrist 1.9 2.0 0.1 16.8 19.6 14.3 Wrist Base 1st Digit     Ulnar Anti Sensory (5th Digit)  32.8C  Wrist 3.7 3.4 0.3 *14.8 *10.2 31.1 Wrist 5th Digit 38 41 3   Motor Left/Right Comparison   Stim Site L Lat (ms) R Lat (ms) L-R Lat (ms) L Amp (mV) R Amp (mV) L-R Amp (%) Site1 Site2 L Vel (m/s) R Vel (m/s) L-R Vel (m/s)  Median Motor (Abd Poll Brev)  32.5C  Wrist *8.1 *11.9 *3.8 *0.6 *1.0 40.0 Elbow Wrist 63 *26 *37  Elbow 11.2 19.1 7.9 4.4 0.5 88.6       Ulnar Motor (Abd Dig Min)  32.5C  Wrist 3.2 2.8 0.4 3.4 6.4 *46.9 B Elbow Wrist 62 57 5  B Elbow 6.6 6.2 0.4 2.9 3.0 3.3 A Elbow B Elbow 83 100 17  A Elbow 7.8 7.2 0.6 3.1 5.8 46.6          Waveforms:                     Clinical History: No specialty comments available.   She reports that she has never smoked. She has never used smokeless tobacco. No results for input(s): HGBA1C, LABURIC in the last 8760 hours.  Objective:  VS:  HT:    WT:   BMI:     BP:   HR: bpm  TEMP: ( )  RESP:  Physical Exam Musculoskeletal:        General: No swelling, tenderness or deformity.     Comments: Inspection reveals flattening of the bilateral APB but no atrophy of the bilateral FDI or hand intrinsics. There is no swelling, color changes, allodynia or  dystrophic changes. There is 5 out of 5 strength in the bilateral wrist extension, finger abduction and long finger flexion. There is intact sensation to light touch in all dermatomal and peripheral nerve distributions. There is a negative Hoffmann's test bilaterally.  Skin:    General: Skin is warm and dry.     Findings: No erythema or rash.  Neurological:     General: No focal deficit present.     Mental Status: She is alert and oriented to person, place, and time.     Motor: No weakness or abnormal muscle tone.     Coordination: Coordination normal.  Psychiatric:        Mood and Affect: Mood normal.        Behavior: Behavior normal.     Ortho Exam Imaging: No results found.  Past Medical/Family/Surgical/Social History: Medications & Allergies reviewed per EMR, new medications updated. Patient Active Problem List   Diagnosis Date Noted  . Uterine prolapse 05/13/2014  . Prolapse of female pelvic organs 05/13/2014   Past Medical History:  Diagnosis Date  . Arthritis    severe  . Kidney calculi    Family History  Problem Relation Age of Onset  . Cancer Father 31  . Heart disease Father   . Colon cancer Father 71  . Diabetes Neg Hx   . Esophageal cancer Neg Hx   . Rectal cancer Neg Hx   .  Stomach cancer Neg Hx   . Colon polyps Neg Hx    Past Surgical History:  Procedure Laterality Date  . ABDOMINAL HYSTERECTOMY     Social History   Occupational History  . Not on file  Tobacco Use  . Smoking status: Never Smoker  . Smokeless tobacco: Never Used  Substance and Sexual Activity  . Alcohol use: No  . Drug use: No  . Sexual activity: Not Currently    Comment: not in past ten years

## 2019-10-29 ENCOUNTER — Ambulatory Visit (INDEPENDENT_AMBULATORY_CARE_PROVIDER_SITE_OTHER): Payer: Medicare Other | Admitting: Orthopaedic Surgery

## 2019-10-29 ENCOUNTER — Encounter: Payer: Self-pay | Admitting: Orthopaedic Surgery

## 2019-10-29 DIAGNOSIS — G5602 Carpal tunnel syndrome, left upper limb: Secondary | ICD-10-CM | POA: Diagnosis not present

## 2019-10-29 DIAGNOSIS — G5601 Carpal tunnel syndrome, right upper limb: Secondary | ICD-10-CM

## 2019-10-29 MED ORDER — GABAPENTIN 100 MG PO CAPS: 100.0000 mg | ORAL_CAPSULE | Freq: Three times a day (TID) | ORAL | 1 refills | Status: DC

## 2019-10-29 MED ORDER — NABUMETONE 750 MG PO TABS: 750.0000 mg | ORAL_TABLET | Freq: Two times a day (BID) | ORAL | 1 refills | Status: DC | PRN

## 2019-10-29 NOTE — Progress Notes (Signed)
The patient is a 58 year old female with debilitating hip disease with significant deformities of both hips.  That being said she gets around usually in a wheelchair using a walker so she puts a lot of pressure on her hands.  With continued numbness and tingling both her hands we sent her for nerve conduction studies with Dr. Ernestina Patches.  She is following up with the studies today.  Both hands have a lot of numbness and tingling in her quite a bit.  Her right hand is more symptomatic than the left side.  On exam there is some muscle atrophy bilaterally.  She has arthritic changes in all her finger joints.  She has good grip strength bilaterally.  She has positive Phalen's and Tinel's exam both sides.  Both hands are well-perfused.  Nerve conduction studies do show severe entrapment of the median nerve at the transverse carpal ligament of both sides.  Given the nerve conduction study findings of severe carpal tunnel syndrome bilaterally and based on clinical exam we recommended a carpal tunnel release.  She would like to proceed with the right side first.  I had a discussion of what the surgery involves.  We talked about the risk and benefits of surgery as well as her interoperative and postoperative course.  We would proceed with the right first and then follow that with the left side after she has healed the incision on her right side.  All question concerns were answered and addressed.  We will work on getting this scheduled.

## 2019-12-07 ENCOUNTER — Telehealth: Payer: Self-pay | Admitting: Orthopaedic Surgery

## 2019-12-07 NOTE — Telephone Encounter (Signed)
Pt called in requesting a handicap placard, please give her a call when it's ready.   [phone]

## 2019-12-10 NOTE — Telephone Encounter (Signed)
Mailed to patient per her request.

## 2020-05-29 ENCOUNTER — Telehealth: Payer: Self-pay

## 2020-05-29 NOTE — Telephone Encounter (Signed)
Patient worked in for next Wednesday for hands only And on the 23rd for hip and knee

## 2020-05-29 NOTE — Telephone Encounter (Signed)
Patient would like to be worked in to see Dr. Magnus Ivan for bilateral hand pain, knee pain, and hip pain.  Stated that she is in severe pain.  Cb# [phone].  Please advise.  Thank you.

## 2020-06-04 ENCOUNTER — Ambulatory Visit: Payer: Medicare Other | Admitting: Orthopaedic Surgery

## 2020-06-18 ENCOUNTER — Ambulatory Visit: Payer: Medicare Other | Admitting: Orthopaedic Surgery

## 2020-07-08 ENCOUNTER — Telehealth: Payer: Self-pay | Admitting: Orthopaedic Surgery

## 2020-07-08 NOTE — Telephone Encounter (Signed)
Patient's daughter Tamera Punt called on behalf of patient requesting a prescription for walker. Daughter states patient needs it to get around. Please call daughter concerning this matter at [phone].

## 2020-07-09 NOTE — Telephone Encounter (Signed)
I agree that we have not seen her for this.  I would recommend them requesting this through their primary care

## 2020-07-09 NOTE — Telephone Encounter (Signed)
Please advise, we haven't seen her since 11/20 and we saw her for hand/carpal tunnel issues

## 2020-07-09 NOTE — Telephone Encounter (Signed)
Patient aware Rx at front desk She states that they don't have a PCP

## 2020-10-20 ENCOUNTER — Ambulatory Visit: Payer: Medicare Other | Admitting: Orthopaedic Surgery

## 2020-10-23 ENCOUNTER — Encounter: Payer: Self-pay | Admitting: Orthopaedic Surgery

## 2020-10-23 ENCOUNTER — Ambulatory Visit: Payer: Self-pay

## 2020-10-23 ENCOUNTER — Ambulatory Visit (INDEPENDENT_AMBULATORY_CARE_PROVIDER_SITE_OTHER): Payer: Medicare Other | Admitting: Orthopaedic Surgery

## 2020-10-23 DIAGNOSIS — G8929 Other chronic pain: Secondary | ICD-10-CM | POA: Diagnosis not present

## 2020-10-23 DIAGNOSIS — M25561 Pain in right knee: Secondary | ICD-10-CM

## 2020-10-23 DIAGNOSIS — M25562 Pain in left knee: Secondary | ICD-10-CM

## 2020-10-23 DIAGNOSIS — M1712 Unilateral primary osteoarthritis, left knee: Secondary | ICD-10-CM | POA: Diagnosis not present

## 2020-10-23 DIAGNOSIS — M1711 Unilateral primary osteoarthritis, right knee: Secondary | ICD-10-CM | POA: Insufficient documentation

## 2020-10-23 MED ORDER — LIDOCAINE HCL 1 % IJ SOLN
3.0000 mL | INTRAMUSCULAR | Status: AC | PRN
  Administered 2020-10-23: 3 mL

## 2020-10-23 MED ORDER — METHYLPREDNISOLONE ACETATE 40 MG/ML IJ SUSP
40.0000 mg | INTRAMUSCULAR | Status: AC | PRN
  Administered 2020-10-23: 40 mg via INTRA_ARTICULAR

## 2020-10-23 MED ORDER — GABAPENTIN 100 MG PO CAPS: 100.0000 mg | ORAL_CAPSULE | Freq: Three times a day (TID) | ORAL | 1 refills | Status: DC

## 2020-10-23 MED ORDER — CELECOXIB 200 MG PO CAPS: 200.0000 mg | ORAL_CAPSULE | Freq: Two times a day (BID) | ORAL | 3 refills | Status: DC | PRN

## 2020-10-23 NOTE — Progress Notes (Signed)
Office Visit Note   Patient: Mariah Morrison           Date of Birth: December 30, 1960           MRN: [id number] Visit Date: 10/23/2020              Requested by: Cain Saupe, MD [address] [place],  [place] [address] PCP: Cain Saupe, MD   Assessment & Plan: Visit Diagnoses:  1. Left knee pain, unspecified chronicity   2. Right knee pain, unspecified chronicity   3. Chronic pain of left knee   4. Unilateral primary osteoarthritis, left knee   5. Unilateral primary osteoarthritis, right knee     Plan: I did recommend a steroid injection in both knees which she agreed to and tolerated well.  I will start her on Celebrex as an anti-inflammatory.  She is a good candidate for hyaluronic acid to try for these knees.  We will work on ordering this for her to see if we get this approved.  All question concerns were answered and addressed.  Follow-Up Instructions: Return in about 4 weeks (around 11/20/2020), or if symptoms worsen or fail to improve.   Orders:  Orders Placed This Encounter  Procedures  . Large Joint Inj  . Large Joint Inj  . XR Knee 1-2 Views Left  . XR Knee 1-2 Views Right   Meds ordered this encounter  Medications  . gabapentin (NEURONTIN) 100 MG capsule    Sig: Take 1 capsule (100 mg total) by mouth 3 (three) times daily.    Dispense:  270 capsule    Refill:  1  . celecoxib (CELEBREX) 200 MG capsule    Sig: Take 1 capsule (200 mg total) by mouth 2 (two) times daily between meals as needed.    Dispense:  60 capsule    Refill:  3      Procedures: Large Joint Inj: R knee on 10/23/2020 4:09 PM Indications: diagnostic evaluation and pain Details: 22 G 1.5 in needle, superolateral approach  Arthrogram: No  Medications: 3 mL lidocaine 1 %; 40 mg methylPREDNISolone acetate 40 MG/ML Outcome: tolerated well, no immediate complications Procedure, treatment alternatives, risks and benefits explained, specific risks discussed. Consent was given by the patient.  Immediately prior to procedure a time out was called to verify the correct patient, procedure, equipment, support staff and site/side marked as required. Patient was prepped and draped in the usual sterile fashion.   Large Joint Inj: L knee on 10/23/2020 4:09 PM Indications: diagnostic evaluation and pain Details: 22 G 1.5 in needle, superolateral approach  Arthrogram: No  Medications: 3 mL lidocaine 1 %; 40 mg methylPREDNISolone acetate 40 MG/ML Outcome: tolerated well, no immediate complications Procedure, treatment alternatives, risks and benefits explained, specific risks discussed. Consent was given by the patient. Immediately prior to procedure a time out was called to verify the correct patient, procedure, equipment, support staff and site/side marked as required. Patient was prepped and draped in the usual sterile fashion.       Clinical Data: No additional findings.   Subjective: Chief Complaint  Patient presents with  . Left Knee - Pain  . Right Knee - Pain  The patient is a morbidly obese 59 year old female comes in for evaluation treatment of chronic bilateral knee pain.  Is gotten really worse for 2 weeks now after urologic procedure.  She has tried ice and heat and Biofreeze.  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  She is  never had surgery on her knees.  Both knees swell and hurt intermittently at times.  She has difficulty ambulating due to her hips and her knees.  I have seen her for her hips before.  There is really nothing we can do for her hips.  HPI  Review of Systems She currently denies any headache, chest pain, shortness of breath, fever, chills, nausea, vomiting  Objective: Vital Signs: LMP 01/13/2014 Comment: very irregular  Physical Exam She is alert and orient x3 and in no acute distress Ortho Exam Examination of both knees shows no effusion but global tenderness with patellofemoral crepitation and painful arc of motion of both  knees. Specialty Comments:  No specialty comments available.  Imaging: XR Knee 1-2 Views Left  Result Date: 10/23/2020 2 views left knee show severe tricompartmental arthritis  XR Knee 1-2 Views Right  Result Date: 10/23/2020 2 views of the right knee show severe tricompartment arthritis    PMFS History: Patient Active Problem List   Diagnosis Date Noted  . Unilateral primary osteoarthritis, left knee 10/23/2020  . Unilateral primary osteoarthritis, right knee 10/23/2020  . Carpal tunnel syndrome, right upper limb 10/29/2019  . Carpal tunnel syndrome, left upper limb 10/29/2019  . Uterine prolapse 05/13/2014  . Prolapse of female pelvic organs 05/13/2014   Past Medical History:  Diagnosis Date  . Arthritis    severe  . Kidney calculi     Family History  Problem Relation Age of Onset  . Cancer Father 61  . Heart disease Father   . Colon cancer Father 37  . Diabetes Neg Hx   . Esophageal cancer Neg Hx   . Rectal cancer Neg Hx   . Stomach cancer Neg Hx   . Colon polyps Neg Hx     Past Surgical History:  Procedure Laterality Date  . ABDOMINAL HYSTERECTOMY     Social History   Occupational History  . Not on file  Tobacco Use  . Smoking status: Never Smoker  . Smokeless tobacco: Never Used  Vaping Use  . Vaping Use: Never used  Substance and Sexual Activity  . Alcohol use: No  . Drug use: No  . Sexual activity: Not Currently    Comment: not in past ten years

## 2020-10-24 ENCOUNTER — Telehealth: Payer: Self-pay

## 2020-10-24 NOTE — Telephone Encounter (Signed)
Bilateral knee gel injections 

## 2020-10-24 NOTE — Telephone Encounter (Signed)
Noted  

## 2020-10-29 ENCOUNTER — Telehealth: Payer: Self-pay

## 2020-10-29 NOTE — Telephone Encounter (Signed)
Submitted VOB, Durolane, bilateral knee. 

## 2020-11-24 ENCOUNTER — Encounter: Payer: Self-pay | Admitting: Physician Assistant

## 2020-11-24 ENCOUNTER — Ambulatory Visit (INDEPENDENT_AMBULATORY_CARE_PROVIDER_SITE_OTHER): Payer: Medicare Other | Admitting: Physician Assistant

## 2020-11-24 ENCOUNTER — Ambulatory Visit: Payer: Medicare Other | Admitting: Orthopaedic Surgery

## 2020-11-24 ENCOUNTER — Telehealth: Payer: Self-pay

## 2020-11-24 DIAGNOSIS — M1711 Unilateral primary osteoarthritis, right knee: Secondary | ICD-10-CM | POA: Diagnosis not present

## 2020-11-24 DIAGNOSIS — M1712 Unilateral primary osteoarthritis, left knee: Secondary | ICD-10-CM

## 2020-11-24 MED ORDER — SODIUM HYALURONATE 60 MG/3ML IX PRSY
60.0000 mg | PREFILLED_SYRINGE | INTRA_ARTICULAR | Status: AC | PRN
  Administered 2020-11-24: 60 mg via INTRA_ARTICULAR

## 2020-11-24 NOTE — Telephone Encounter (Signed)
Approved for Durolane-Right knee Dr. Kathrin Greathouse and Annette Stable No copay 20% OOP No prior auth required

## 2020-11-24 NOTE — Progress Notes (Signed)
   Procedure Note  Patient: Mariah Morrison             Date of Birth: Jul 12, 1961           MRN: [id number]             Visit Date: 11/24/2020 HPI: Mrs. Grinage returns today for Durolane injections both knees.  She states that the cortisone injection of both knees helped some.  He has left worse than right knee pain.  Severe tricompartmental arthritis of both knees.  Physical exam: Bilateral knees no abnormal warmth erythema or effusion. Procedures: Visit Diagnoses:  1. Unilateral primary osteoarthritis, left knee   2. Unilateral primary osteoarthritis, right knee     Large Joint Inj: bilateral knee on 11/24/2020 4:47 PM Indications: pain Details: 22 G 1.5 in needle, anterolateral approach  Arthrogram: No  Medications (Right): 60 mg Sodium Hyaluronate 60 MG/3ML Medications (Left): 60 mg Sodium Hyaluronate 60 MG/3ML Outcome: tolerated well, no immediate complications Procedure, treatment alternatives, risks and benefits explained, specific risks discussed. Consent was given by the patient. Immediately prior to procedure a time out was called to verify the correct patient, procedure, equipment, support staff and site/side marked as required. Patient was prepped and draped in the usual sterile fashion.     Plan: Follow-up in approximately 8 weeks to see what type of response she had to the injection.  Questions encouraged and answered.  Encourage patient to continue to work on quad strengthening both knees.

## 2021-01-19 ENCOUNTER — Ambulatory Visit: Payer: Medicare Other | Admitting: Physician Assistant

## 2021-02-09 ENCOUNTER — Ambulatory Visit: Payer: Medicare Other | Admitting: Physician Assistant

## 2021-10-20 DIAGNOSIS — R8290 Unspecified abnormal findings in urine: Secondary | ICD-10-CM | POA: Diagnosis not present

## 2022-01-05 ENCOUNTER — Telehealth: Payer: Self-pay

## 2022-01-05 NOTE — Telephone Encounter (Signed)
Patient called into the office stating that she would like to have some medication sent in until her upcoming apt.  She also asked if she would be a good candidate for medical marijuana.   Please advise

## 2022-01-05 NOTE — Telephone Encounter (Signed)
Tried calling pt on both numbers. No answer and no way to lvm

## 2022-01-19 ENCOUNTER — Encounter: Payer: Self-pay | Admitting: Orthopaedic Surgery

## 2022-01-19 ENCOUNTER — Telehealth: Payer: Self-pay

## 2022-01-19 ENCOUNTER — Telehealth: Payer: Self-pay | Admitting: Orthopaedic Surgery

## 2022-01-19 ENCOUNTER — Ambulatory Visit (INDEPENDENT_AMBULATORY_CARE_PROVIDER_SITE_OTHER): Payer: Medicare Other | Admitting: Orthopaedic Surgery

## 2022-01-19 DIAGNOSIS — M25561 Pain in right knee: Secondary | ICD-10-CM

## 2022-01-19 DIAGNOSIS — M1712 Unilateral primary osteoarthritis, left knee: Secondary | ICD-10-CM

## 2022-01-19 DIAGNOSIS — M1711 Unilateral primary osteoarthritis, right knee: Secondary | ICD-10-CM | POA: Diagnosis not present

## 2022-01-19 DIAGNOSIS — M25562 Pain in left knee: Secondary | ICD-10-CM | POA: Diagnosis not present

## 2022-01-19 DIAGNOSIS — G8929 Other chronic pain: Secondary | ICD-10-CM

## 2022-01-19 MED ORDER — LIDOCAINE HCL 1 % IJ SOLN
3.0000 mL | INTRAMUSCULAR | Status: AC | PRN
  Administered 2022-01-19: 10:00:00 3 mL

## 2022-01-19 MED ORDER — METHYLPREDNISOLONE ACETATE 40 MG/ML IJ SUSP
40.0000 mg | INTRAMUSCULAR | Status: AC | PRN
  Administered 2022-01-19: 10:00:00 40 mg via INTRA_ARTICULAR

## 2022-01-19 NOTE — Progress Notes (Signed)
Office Visit Note   Patient: Mariah Morrison           Date of Birth: January 02, 1961           MRN: [id number] Visit Date: 01/19/2022              Requested by: No referring provider defined for this encounter. PCP: Antony Blackbird, MD (Inactive)   Assessment & Plan: Visit Diagnoses:  1. Chronic pain of left knee   2. Chronic pain of right knee   3. Unilateral primary osteoarthritis, left knee   4. Unilateral primary osteoarthritis, right knee     Plan: Per the patient's request I did provide a steroid injection of both knees today which she tolerated well.  We will once again order hyaluronic acid for both knees to treat the pain from osteoarthritis and we will see her back in follow-up once we have those injections available.  Follow-Up Instructions: No follow-ups on file.   Orders:  Orders Placed This Encounter  Procedures   Large Joint Inj   Large Joint Inj   No orders of the defined types were placed in this encounter.     Procedures: Large Joint Inj: R knee on 01/19/2022 10:29 AM Indications: diagnostic evaluation and pain Details: 22 G 1.5 in needle, superolateral approach  Arthrogram: No  Medications: 3 mL lidocaine 1 %; 40 mg methylPREDNISolone acetate 40 MG/ML Outcome: tolerated well, no immediate complications Procedure, treatment alternatives, risks and benefits explained, specific risks discussed. Consent was given by the patient. Immediately prior to procedure a time out was called to verify the correct patient, procedure, equipment, support staff and site/side marked as required. Patient was prepped and draped in the usual sterile fashion.    Large Joint Inj: L knee on 01/19/2022 10:29 AM Indications: diagnostic evaluation and pain Details: 22 G 1.5 in needle, superolateral approach  Arthrogram: No  Medications: 3 mL lidocaine 1 %; 40 mg methylPREDNISolone acetate 40 MG/ML Outcome: tolerated well, no immediate complications Procedure, treatment alternatives,  risks and benefits explained, specific risks discussed. Consent was given by the patient. Immediately prior to procedure a time out was called to verify the correct patient, procedure, equipment, support staff and site/side marked as required. Patient was prepped and draped in the usual sterile fashion.      Clinical Data: No additional findings.   Subjective: Chief Complaint  Patient presents with   Left Knee - Pain   Right Knee - Pain  The patient has known severe end-stage arthritis of both her knees.  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  She is morbidly obese.  She is having a hard time getting around.  She has a history of bilateral knee hyaluronic acid injection as well as lasted her for over 6 months.  These were done in November 2021.  She is interested in those types of injections again.  She has a hard time getting around is mainly wheelchair-bound due to her hip dysplasia combined with her bad knees.  She does have a urologic procedure coming up a month from now due to a prolapsed bladder.  She will have to have her knees bent for those procedures.  She can bend her knees and she is painful to do so. HPI  Review of Systems There is currently no acute change in medical status including fever, chills, nausea, vomiting  Objective: Vital Signs: LMP 01/13/2014 Comment: very irregular  Physical Exam She is alert and orient x3 and in  no acute distress Ortho Exam Examination of both her knees shows slight effusion with slight varus malalignment and pain throughout the arc of motion of both knees.  They are painful to flex but I can flex her knees. Specialty Comments:  No specialty comments available.  Imaging: No results found.   PMFS History: Patient Active Problem List   Diagnosis Date Noted   Unilateral primary osteoarthritis, left knee 10/23/2020   Unilateral primary osteoarthritis, right knee 10/23/2020   Carpal tunnel syndrome, right upper limb  10/29/2019   Carpal tunnel syndrome, left upper limb 10/29/2019   Uterine prolapse 05/13/2014   Prolapse of female pelvic organs 05/13/2014   Past Medical History:  Diagnosis Date   Arthritis    severe   Kidney calculi     Family History  Problem Relation Age of Onset   Cancer Father 51   Heart disease Father    Colon cancer Father 45   Diabetes Neg Hx    Esophageal cancer Neg Hx    Rectal cancer Neg Hx    Stomach cancer Neg Hx    Colon polyps Neg Hx     Past Surgical History:  Procedure Laterality Date   ABDOMINAL HYSTERECTOMY     Social History   Occupational History   Not on file  Tobacco Use   Smoking status: Never   Smokeless tobacco: Never  Vaping Use   Vaping Use: Never used  Substance and Sexual Activity   Alcohol use: No   Drug use: No   Sexual activity: Not Currently    Comment: not in past ten years

## 2022-01-19 NOTE — Telephone Encounter (Signed)
Bilateral knee gel injections 

## 2022-01-19 NOTE — Telephone Encounter (Signed)
Pt called with an FYI. Cortisone is working in right knee and left is still having issues due to the rain

## 2022-01-20 NOTE — Telephone Encounter (Signed)
Noted  

## 2022-01-26 ENCOUNTER — Encounter: Payer: Self-pay | Admitting: Orthopedic Surgery

## 2022-01-26 ENCOUNTER — Other Ambulatory Visit: Payer: Self-pay

## 2022-01-26 ENCOUNTER — Ambulatory Visit (INDEPENDENT_AMBULATORY_CARE_PROVIDER_SITE_OTHER): Payer: Medicare Other | Admitting: Orthopedic Surgery

## 2022-01-26 VITALS — BP 118/76 | HR 123 | Ht 65.0 in | Wt 265.0 lb

## 2022-01-26 DIAGNOSIS — G5601 Carpal tunnel syndrome, right upper limb: Secondary | ICD-10-CM

## 2022-01-26 DIAGNOSIS — G5602 Carpal tunnel syndrome, left upper limb: Secondary | ICD-10-CM | POA: Diagnosis not present

## 2022-01-26 NOTE — Progress Notes (Signed)
Office Visit Note   Patient: Mariah Morrison           Date of Birth: 04-Oct-1961           MRN: [id number] Visit Date: 01/26/2022              Requested by: No referring provider defined for this encounter. PCP: Antony Blackbird, MD (Inactive)   Assessment & Plan: Visit Diagnoses:  1. Carpal tunnel syndrome, right upper limb   2. Carpal tunnel syndrome, left upper limb     Plan: We discussed the diagnosis, prognosis, and both conservative and operative treatment options for carpal tunnel syndrome.  After our discussion, the patient has elected to proceed with carpal tunnel release.  We reviewed the benefits of surgery and the potential risks including, but not limited to, persistent symptoms, infection, damage to nearby nerves and blood vessels, delayed wound healing, need for additional surgery.    All patient concerns and questions were addressed.  A surgical date will be confirmed with the patient.    Follow-Up Instructions: No follow-ups on file.   Orders:  No orders of the defined types were placed in this encounter.  No orders of the defined types were placed in this encounter.     Procedures: No procedures performed   Clinical Data: No additional findings.   Subjective: Chief Complaint  Patient presents with   Right Hand - Numbness, Pain   Left Hand - Numbness, Pain    This is a 61 year old right-hand-dominant female who presents with bilateral hand numbness and tingling for several years.  Her symptoms have worsened over the last year.  She describes numbness and tingling in all of her fingers on both hands.  Her hands are equal in terms of involvement.  She notes that she has been dropping things and has subjective weakness.  Her tingling is near constant.  She has nocturnal symptoms 7 nights per week and when she wakes with tingling and aching pain in her hands.  She takes a hot shower when she wakes up and this makes her feel better.  She never had any  treatment for this.  Risk Factors Diabetes: N Hypothyroid: N C-spine: N Inflammatory: N History of trauma: N  Nocturnal Symptoms: Yes, 7 nights/week  Electrodiagnostic studies: 09/2019; severe bilateral CTS  Treatment to date: None    Review of Systems   Objective: Vital Signs: BP 118/76 (BP Location: Left Arm, Patient Position: Sitting)    Pulse (!) 123    Ht 5\' 5"  (1.651 m)    Wt 265 lb (120.2 kg)    LMP 01/13/2014 Comment: very irregular   BMI 44.10 kg/m   Physical Exam Constitutional:      Appearance: Normal appearance.  Cardiovascular:     Rate and Rhythm: Normal rate.     Pulses: Normal pulses.  Pulmonary:     Effort: Pulmonary effort is normal.  Skin:    General: Skin is warm and dry.     Capillary Refill: Capillary refill takes less than 2 seconds.  Neurological:     Mental Status: She is alert.    Right Hand Exam   Tenderness  The patient is experiencing no tenderness.   Other  Erythema: absent Sensation: normal Pulse: present  Comments:  + Tinel at wrist.  Neg Phalen and CT compression.  Neg Tinel at elbow.  4-/5 thenar motor with mild atrophy.  5/5 FDI strength.     Left Hand Exam   Tenderness  The  patient is experiencing no tenderness.   Other  Sensation: normal Pulse: present  Comments:  + Tinel at wrist.  Neg Phalen and CT compression.  Neg Tinel at elbow.  4-/5 thenar motor with mild atrophy.  5/5 FDI strength.       Specialty Comments:  No specialty comments available.  Imaging: No results found.   PMFS History: Patient Active Problem List   Diagnosis Date Noted   Unilateral primary osteoarthritis, left knee 10/23/2020   Unilateral primary osteoarthritis, right knee 10/23/2020   Carpal tunnel syndrome, right upper limb 10/29/2019   Carpal tunnel syndrome, left upper limb 10/29/2019   Uterine prolapse 05/13/2014   Prolapse of female pelvic organs 05/13/2014   Past Medical History:  Diagnosis Date   Arthritis    severe    Kidney calculi     Family History  Problem Relation Age of Onset   Cancer Father 49   Heart disease Father    Colon cancer Father 62   Diabetes Neg Hx    Esophageal cancer Neg Hx    Rectal cancer Neg Hx    Stomach cancer Neg Hx    Colon polyps Neg Hx     Past Surgical History:  Procedure Laterality Date   ABDOMINAL HYSTERECTOMY     Social History   Occupational History   Not on file  Tobacco Use   Smoking status: Never   Smokeless tobacco: Never  Vaping Use   Vaping Use: Never used  Substance and Sexual Activity   Alcohol use: No   Drug use: No   Sexual activity: Not Currently    Comment: not in past ten years

## 2022-02-01 ENCOUNTER — Telehealth: Payer: Self-pay | Admitting: Orthopedic Surgery

## 2022-02-01 NOTE — Telephone Encounter (Signed)
LMOM of the below message from Dr. Blackman  

## 2022-02-01 NOTE — Telephone Encounter (Signed)
Mariah Morrison is calling in to give Dr. Magnus Ivan an update on the gel shots that she received.  She states that her legs hurting and she is just "shuffling" around, she is having trouble walking.  She complains that it is her legs not her knees that hurting her.  Her knees feel "marshmallowy"  and her thighs feel bruised.  There is no redness or swelling.  She can be reached at [phone].

## 2022-02-05 ENCOUNTER — Telehealth: Payer: Self-pay

## 2022-02-05 NOTE — Telephone Encounter (Signed)
VOB submitted for Durolane, bilateral knee. BV pending. 

## 2022-02-12 ENCOUNTER — Telehealth: Payer: Self-pay

## 2022-02-12 NOTE — Telephone Encounter (Signed)
Approved for Durolane, bilateral knee. Buy & Bill Patient will be responsible for 20% OOP. No Co-pay No PA required  Appt. 02/24/2022 with Dr. Magnus Ivan

## 2022-02-16 ENCOUNTER — Encounter (HOSPITAL_COMMUNITY): Payer: Self-pay | Admitting: Orthopedic Surgery

## 2022-02-16 ENCOUNTER — Other Ambulatory Visit: Payer: Self-pay

## 2022-02-16 NOTE — Progress Notes (Signed)
DUE TO COVID-19 ONLY ONE VISITOR IS ALLOWED TO COME WITH YOU AND STAY IN THE WAITING ROOM ONLY DURING PRE OP AND PROCEDURE DAY OF SURGERY.   PCP - Dr Cain Saupe Cardiologist - n/a  Chest x-ray - n/a EKG - n/a Stress Test - n/a ECHO - n/a Cardiac Cath - n/a  ICD Pacemaker/Loop - n/a  Sleep Study -  n/a CPAP - none  STOP now taking any Aspirin (unless otherwise instructed by your surgeon), Aleve, Naproxen, Ibuprofen, Motrin, Advil, Goody's, BC's, all herbal medications, fish oil, and all vitamins.   Coronavirus Screening Covid test n/a Ambulatory Surgery  Do you have any of the following symptoms:  Cough yes/no: No Fever (>100.21F)  yes/no: No Runny nose yes/no: No Sore throat yes/no: No Difficulty breathing/shortness of breath  yes/no: No  Have you traveled in the last 14 days and where? yes/no: No  Patient verbalized understanding of instructions that were given via phone.

## 2022-02-17 ENCOUNTER — Encounter (HOSPITAL_COMMUNITY): Payer: Self-pay | Admitting: Orthopedic Surgery

## 2022-02-17 ENCOUNTER — Ambulatory Visit (HOSPITAL_COMMUNITY)
Admission: RE | Admit: 2022-02-17 | Discharge: 2022-02-17 | Disposition: A | Discharge status: Home / Self Care | Payer: Medicare Other | Attending: Orthopedic Surgery | Admitting: Orthopedic Surgery

## 2022-02-17 ENCOUNTER — Encounter (HOSPITAL_COMMUNITY): Payer: Self-pay | Admitting: Anesthesiology

## 2022-02-17 ENCOUNTER — Encounter (HOSPITAL_COMMUNITY)
Admission: RE | Disposition: A | Discharge status: Home / Self Care | Payer: Self-pay | Source: Home / Self Care | Attending: Orthopedic Surgery

## 2022-02-17 DIAGNOSIS — Z5329 Procedure and treatment not carried out because of patient's decision for other reasons: Secondary | ICD-10-CM | POA: Insufficient documentation

## 2022-02-17 DIAGNOSIS — G5601 Carpal tunnel syndrome, right upper limb: Secondary | ICD-10-CM | POA: Diagnosis not present

## 2022-02-17 HISTORY — DX: Personal history of urinary calculi: Z87.442

## 2022-02-17 HISTORY — DX: Anemia, unspecified: D64.9

## 2022-02-17 LAB — CBC
HCT: 45.4 % (ref 36.0–46.0)
Hemoglobin: 15.3 g/dL — ABNORMAL HIGH (ref 12.0–15.0)
MCH: 29.7 pg (ref 26.0–34.0)
MCHC: 33.7 g/dL (ref 30.0–36.0)
MCV: 88.2 fL (ref 80.0–100.0)
Platelets: 264 10*3/uL (ref 150–400)
RBC: 5.15 MIL/uL — ABNORMAL HIGH (ref 3.87–5.11)
RDW: 14.2 % (ref 11.5–15.5)
WBC: 8.9 10*3/uL (ref 4.0–10.5)
nRBC: 0 % (ref 0.0–0.2)

## 2022-02-17 SURGERY — CARPAL TUNNEL RELEASE
Anesthesia: Monitor Anesthesia Care | Laterality: Right

## 2022-02-17 MED ORDER — CEFAZOLIN SODIUM-DEXTROSE 2-4 GM/100ML-% IV SOLN
INTRAVENOUS | Status: AC
  Filled 2022-02-17: qty 100

## 2022-02-17 MED ORDER — ORAL CARE MOUTH RINSE: 15.0000 mL | Freq: Once | OROMUCOSAL | Status: DC

## 2022-02-17 MED ORDER — CHLORHEXIDINE GLUCONATE 0.12 % MT SOLN: 15.0000 mL | Freq: Once | OROMUCOSAL | Status: DC

## 2022-02-17 MED ORDER — ACETAMINOPHEN 500 MG PO TABS: 1000.0000 mg | ORAL_TABLET | Freq: Once | ORAL | Status: AC

## 2022-02-17 MED ORDER — LACTATED RINGERS IV SOLN: INTRAVENOUS | Status: DC

## 2022-02-17 MED ORDER — ACETAMINOPHEN 500 MG PO TABS
ORAL_TABLET | ORAL | Status: AC
  Administered 2022-02-17: 1000 mg via ORAL
  Filled 2022-02-17: qty 2

## 2022-02-17 NOTE — Anesthesia Preprocedure Evaluation (Deleted)
Anesthesia Evaluation  Patient identified by MRN, date of birth, ID band Patient awake    Reviewed: Allergy & Precautions, NPO status , Patient's Chart, lab work & pertinent test results  Airway Mallampati: III  TM Distance: >3 FB Neck ROM: Full    Dental  (+) Poor Dentition, Missing, Dental Advisory Given   Pulmonary neg pulmonary ROS,    Pulmonary exam normal breath sounds clear to auscultation       Cardiovascular negative cardio ROS Normal cardiovascular exam Rhythm:Regular Rate:Normal     Neuro/Psych negative neurological ROS  negative psych ROS   GI/Hepatic negative GI ROS, Neg liver ROS,   Endo/Other  Morbid obesityBMI 42  Renal/GU negative Renal ROS  negative genitourinary   Musculoskeletal  (+) Arthritis , Osteoarthritis,    Abdominal (+) + obese,   Peds  Hematology negative hematology ROS (+)   Anesthesia Other Findings   Reproductive/Obstetrics negative OB ROS                            Anesthesia Physical Anesthesia Plan  ASA: 3  Anesthesia Plan: MAC   Post-op Pain Management: Tylenol PO (pre-op)*   Induction:   PONV Risk Score and Plan: 2 and Propofol infusion and TIVA  Airway Management Planned: Natural Airway and Simple Face Mask  Additional Equipment: None  Intra-op Plan:   Post-operative Plan:   Informed Consent: I have reviewed the patients History and Physical, chart, labs and discussed the procedure including the risks, benefits and alternatives for the proposed anesthesia with the patient or authorized representative who has indicated his/her understanding and acceptance.     Dental advisory given  Plan Discussed with: CRNA  Anesthesia Plan Comments:       Anesthesia Quick Evaluation

## 2022-02-17 NOTE — Progress Notes (Signed)
Patient's nurse had notified patient that procedure was delayed by approximately 1 hour.  Nurse notified this nurse that patient was wanting to leave now.    This nurse went to speak with patient to listen to her concerns and explain that the surgeon was in a procedure but would be out soon and could speak with her. She began to raise her voice and state that she was not putting her daughter through this any longer and was leaving.    OR and Dr. Frazier Butt made aware.

## 2022-02-17 NOTE — Progress Notes (Signed)
Pt refused her Peridex mouthwash. I explained to her that it is an antiseptic mouthwash that we give all patients going to OR to help prevent possible pneumonia.  She declined the mouthwash.

## 2022-02-17 NOTE — Progress Notes (Signed)
Informed patient that Mariah Morrison surgery will be delayed by approximately one hour due to delay in surgeon's previous case. Patient stated that she did not want to wait. She said that she wanted to go home now. Patient also spoke with charge nurse,Mariah Morrison. Contacted patient's daughter,Mariah Morrison, who is patient's ride home. Mariah Morrison is at the hospital and is aware that Mariah Morrison mother has decided not to have surgery today due to delay. D/C'd  pt's IV and assisted Mariah Morrison with getting dressed. Pt taken by wheelchair to daughter's car. Pt discharged to care of daughter.

## 2022-02-24 ENCOUNTER — Encounter: Payer: Self-pay | Admitting: Orthopaedic Surgery

## 2022-02-24 ENCOUNTER — Ambulatory Visit (INDEPENDENT_AMBULATORY_CARE_PROVIDER_SITE_OTHER): Payer: Medicare Other | Admitting: Orthopaedic Surgery

## 2022-02-24 DIAGNOSIS — M170 Bilateral primary osteoarthritis of knee: Secondary | ICD-10-CM | POA: Diagnosis not present

## 2022-02-24 DIAGNOSIS — M1712 Unilateral primary osteoarthritis, left knee: Secondary | ICD-10-CM

## 2022-02-24 DIAGNOSIS — M1711 Unilateral primary osteoarthritis, right knee: Secondary | ICD-10-CM

## 2022-02-24 MED ORDER — SODIUM HYALURONATE 60 MG/3ML IX PRSY
60.0000 mg | PREFILLED_SYRINGE | INTRA_ARTICULAR | Status: AC | PRN
  Administered 2022-02-24: 60 mg via INTRA_ARTICULAR

## 2022-02-24 NOTE — Progress Notes (Signed)
? ?  Procedure Note ? ?Patient: Mariah Morrison             ?Date of Birth: 10/07/1961           ?MRN: [id number]             ?Visit Date: 02/24/2022 ? ?Procedures: ?Visit Diagnoses:  ?1. Unilateral primary osteoarthritis, left knee   ?2. Unilateral primary osteoarthritis, right knee   ? ? ?Large Joint Inj: bilateral knee on 02/24/2022 12:49 PM ?Indications: diagnostic evaluation and pain ?Details: 22 G 1.5 in needle, superolateral approach ? ?Arthrogram: No ? ?Medications (Right): 60 mg Sodium Hyaluronate 60 MG/3ML ?Medications (Left): 60 mg Sodium Hyaluronate 60 MG/3ML ?Outcome: tolerated well, no immediate complications ?Procedure, treatment alternatives, risks and benefits explained, specific risks discussed. Consent was given by the patient. Immediately prior to procedure a time out was called to verify the correct patient, procedure, equipment, support staff and site/side marked as required. Patient was prepped and draped in the usual sterile fashion.  ? ?The patient is here today for scheduled hyaluronic acid injections in both knees to treat the pain from osteoarthritis.  Conservative treatment has failed including steroid injections.  This is osteoarthritis type of pain that the patient is experiencing.  Injections today will be with Durolane in each knee. ? ?Neither knee has an effusion today.  Both knees have pain throughout the arc of motion with obvious osteoarthritis pain. ?I did place hyaluronic acid with Durolane in both knees today which she tolerated well.  She understands follow-up is as needed and that these can always be repeated at the minimum of 6 months if needed and if insurance covers it still. ? ? ? ?

## 2022-02-26 ENCOUNTER — Encounter: Payer: Medicare Other | Admitting: Orthopedic Surgery

## 2022-04-16 ENCOUNTER — Telehealth: Payer: Self-pay | Admitting: Orthopaedic Surgery

## 2022-04-16 NOTE — Telephone Encounter (Signed)
called patient, no answer. Will try back  ?

## 2022-04-16 NOTE — Telephone Encounter (Signed)
Patient called asked what is the best over the counter medication she can get for her back. Patient said her back is really hurting her. The number to contact patient is [phone] ?

## 2022-04-19 NOTE — Telephone Encounter (Signed)
Called patient again, no answer. If patient calls back can we please give message from [place]  ?

## 2022-05-07 ENCOUNTER — Telehealth: Payer: Self-pay | Admitting: Orthopaedic Surgery

## 2022-05-07 NOTE — Telephone Encounter (Signed)
Patient called asked what kind of Topical cream can she apply to her hips and knees? Patient said she is in a lot of pain. The number to contact patient  [phone]  ?

## 2022-05-07 NOTE — Telephone Encounter (Signed)
Called patient left message to return call. Per Morrie Sheldon patient can get Voltaren Gel OTC cream.  ?

## 2022-05-19 ENCOUNTER — Ambulatory Visit (INDEPENDENT_AMBULATORY_CARE_PROVIDER_SITE_OTHER): Payer: Medicare Other | Admitting: Orthopaedic Surgery

## 2022-05-19 DIAGNOSIS — M25552 Pain in left hip: Secondary | ICD-10-CM | POA: Diagnosis not present

## 2022-05-19 MED ORDER — METHYLPREDNISOLONE ACETATE 40 MG/ML IJ SUSP
40.0000 mg | INTRAMUSCULAR | Status: AC | PRN
  Administered 2022-05-19: 40 mg via INTRA_ARTICULAR

## 2022-05-19 MED ORDER — LIDOCAINE HCL 1 % IJ SOLN
3.0000 mL | INTRAMUSCULAR | Status: AC | PRN
  Administered 2022-05-19: 3 mL

## 2022-05-19 NOTE — Progress Notes (Signed)
Office Visit Note   Patient: Mariah Morrison           Date of Birth: March 07, 1961           MRN: [id number] Visit Date: 05/19/2022              Requested by: Cain Saupe, MD [address] [place],  [place] [address] PCP: Cain Saupe, MD   Assessment & Plan: Visit Diagnoses:  1. Pain of left hip     Plan: Right now the best option for her is a trochanteric injection with a steroid over the left hip area.  She agreed to this and tolerated it well.  Follow-up can be as needed.  She knows to wait at least 4 months between these types of injections.  Follow-Up Instructions: Return if symptoms worsen or fail to improve.   Orders:  Orders Placed This Encounter  Procedures   Large Joint Inj   No orders of the defined types were placed in this encounter.     Procedures: Large Joint Inj: L greater trochanter on 05/19/2022 1:14 PM Indications: pain and diagnostic evaluation Details: 22 G 1.5 in needle, lateral approach  Arthrogram: No  Medications: 3 mL lidocaine 1 %; 40 mg methylPREDNISolone acetate 40 MG/ML Outcome: tolerated well, no immediate complications Procedure, treatment alternatives, risks and benefits explained, specific risks discussed. Consent was given by the patient. Immediately prior to procedure a time out was called to verify the correct patient, procedure, equipment, support staff and site/side marked as required. Patient was prepped and draped in the usual sterile fashion.      Clinical Data: No additional findings.   Subjective: Chief Complaint  Patient presents with   Left Hip - Pain  The patient is well-known to [place].  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  She is not a surgical candidate based on the severe deformities of her hips.  This is been well-documented for some time now.  She comes in with more acute pain of her left hip but it is over the trochanteric side of the hip where she hurts the most.  There  is been no acute injury.  Hurts to lay on that side.  She is someone who is morbidly obese and both her hips and both her knees are significantly deformed.  HPI  Review of Systems There is currently listed no fever, chills, nausea, vomiting  Objective: Vital Signs: LMP 01/13/2014 Comment: very irregular  Physical Exam She is alert and orient x3 and in no acute distress Ortho Exam She has significant Trendelenburg gait with waddling to either side.  Both knees have valgus malalignment.  Her left hip shows significant pain over the trochanteric area of the left hip consistent with bursitis and tendinitis. Specialty Comments:  No specialty comments available.  Imaging: No results found.   PMFS History: Patient Active Problem List   Diagnosis Date Noted   Unilateral primary osteoarthritis, left knee 10/23/2020   Unilateral primary osteoarthritis, right knee 10/23/2020   Carpal tunnel syndrome, right upper limb 10/29/2019   Carpal tunnel syndrome, left upper limb 10/29/2019   Uterine prolapse 05/13/2014   Prolapse of female pelvic organs 05/13/2014   Past Medical History:  Diagnosis Date   Anemia    w/pregnancy, no current problems   Arthritis    severe   History of kidney stones    passed stones    Family History  Problem Relation Age of Onset   Cancer  Father 52   Heart disease Father    Colon cancer Father 56   Diabetes Neg Hx    Esophageal cancer Neg Hx    Rectal cancer Neg Hx    Stomach cancer Neg Hx    Colon polyps Neg Hx     Past Surgical History:  Procedure Laterality Date   ABDOMINAL HYSTERECTOMY  06/07/2014   COLONOSCOPY     Social History   Occupational History   Not on file  Tobacco Use   Smoking status: Never   Smokeless tobacco: Never  Vaping Use   Vaping Use: Never used  Substance and Sexual Activity   Alcohol use: No   Drug use: No   Sexual activity: Not Currently    Birth control/protection: Surgical    Comment: Hysterectomy

## 2022-05-20 ENCOUNTER — Telehealth: Payer: Self-pay | Admitting: Orthopaedic Surgery

## 2022-05-20 NOTE — Telephone Encounter (Signed)
Pt called stated her injection worked and she wanted to say thank so much

## 2022-07-09 ENCOUNTER — Telehealth: Payer: Self-pay | Admitting: Orthopaedic Surgery

## 2022-07-09 NOTE — Telephone Encounter (Signed)
Please advise 

## 2022-07-09 NOTE — Telephone Encounter (Signed)
Pt called requesting medical advice what pt can take to help her sleep. Please call pt at [phone]

## 2022-08-03 ENCOUNTER — Telehealth: Payer: Self-pay | Admitting: Orthopaedic Surgery

## 2022-08-03 NOTE — Telephone Encounter (Signed)
Patient called advised she would like to get the gel injections again in both of her knees. The number to contact patient is [phone]

## 2022-08-03 NOTE — Telephone Encounter (Signed)
Next available gel injection would need to be after 08/27/2022.  Will submit

## 2022-08-10 ENCOUNTER — Ambulatory Visit (INDEPENDENT_AMBULATORY_CARE_PROVIDER_SITE_OTHER): Payer: Medicare Other

## 2022-08-10 ENCOUNTER — Encounter: Payer: Self-pay | Admitting: Urgent Care

## 2022-08-10 ENCOUNTER — Ambulatory Visit
Admission: EM | Admit: 2022-08-10 | Discharge: 2022-08-10 | Disposition: A | Discharge status: Home / Self Care | Payer: Medicare Other | Attending: Urgent Care | Admitting: Urgent Care

## 2022-08-10 DIAGNOSIS — R058 Other specified cough: Secondary | ICD-10-CM | POA: Diagnosis not present

## 2022-08-10 DIAGNOSIS — J209 Acute bronchitis, unspecified: Secondary | ICD-10-CM | POA: Insufficient documentation

## 2022-08-10 DIAGNOSIS — Z20822 Contact with and (suspected) exposure to covid-19: Secondary | ICD-10-CM | POA: Insufficient documentation

## 2022-08-10 DIAGNOSIS — R0989 Other specified symptoms and signs involving the circulatory and respiratory systems: Secondary | ICD-10-CM | POA: Insufficient documentation

## 2022-08-10 DIAGNOSIS — R059 Cough, unspecified: Secondary | ICD-10-CM

## 2022-08-10 DIAGNOSIS — R000 Tachycardia, unspecified: Secondary | ICD-10-CM | POA: Insufficient documentation

## 2022-08-10 MED ORDER — PROMETHAZINE-DM 6.25-15 MG/5ML PO SYRP: 5.0000 mL | ORAL_SOLUTION | Freq: Three times a day (TID) | ORAL | 0 refills | Status: DC | PRN

## 2022-08-10 MED ORDER — PREDNISONE 20 MG PO TABS
ORAL_TABLET | ORAL | 0 refills | Status: DC
Start: 2022-08-10 — End: 2023-07-15

## 2022-08-10 NOTE — ED Triage Notes (Signed)
Pt here with congestion and very productive cough x 1 week.

## 2022-08-10 NOTE — ED Provider Notes (Signed)
[facility]   MRN: [id number] DOB: Mar 09, 1961  Subjective:   Mariah Morrison is a 61 y.o. female presenting for 1 week history of persistent chest congestion, productive cough, malaise and fatigue.  No fever, chest pain, runny or stuffy nose, sore throat.  No history of asthma.  Patient is not a smoker.  Reports that she avoids the general public but is not opposed to COVID testing.  No current facility-administered medications for this encounter.  Current Outpatient Medications:    acetaminophen (TYLENOL) 650 MG CR tablet, Take 650 mg by mouth every 8 (eight) hours as needed for pain., Disp: , Rfl:    ibuprofen (ADVIL) 200 MG tablet, Take 200 mg by mouth every 6 (six) hours as needed for mild pain or moderate pain. Motrin, Disp: , Rfl:    Menthol-Camphor (TIGER BALM ARTHRITIS RUB) 11-11 % CREA, Apply 1 application topically daily as needed (pain)., Disp: , Rfl:    naproxen sodium (ALEVE) 220 MG tablet, Take 220 mg by mouth daily as needed (Pain)., Disp: , Rfl:    Allergies  Allergen Reactions   No Known Allergies     Past Medical History:  Diagnosis Date   Anemia    w/pregnancy, no current problems   Arthritis    severe   History of kidney stones    passed stones     Past Surgical History:  Procedure Laterality Date   ABDOMINAL HYSTERECTOMY  06/07/2014   COLONOSCOPY      Family History  Problem Relation Age of Onset   Cancer Father 25   Heart disease Father    Colon cancer Father 41   Diabetes Neg Hx    Esophageal cancer Neg Hx    Rectal cancer Neg Hx    Stomach cancer Neg Hx    Colon polyps Neg Hx     Social History   Tobacco Use   Smoking status: Never   Smokeless tobacco: Never  Vaping Use   Vaping Use: Never used  Substance Use Topics   Alcohol use: No   Drug use: No    ROS   Objective:   Vitals: BP (!) 141/95   Pulse (!) 117   Temp 97.6 F (36.4 C)   Resp (!) 26   LMP 01/13/2014 Comment: very irregular  SpO2 94%    Physical Exam Constitutional:      General: She is not in acute distress.    Appearance: Normal appearance. She is well-developed. She is obese. She is not ill-appearing, toxic-appearing or diaphoretic.  HENT:     Head: Normocephalic and atraumatic.     Nose: Nose normal.     Mouth/Throat:     Mouth: Mucous membranes are moist.  Eyes:     General: No scleral icterus.       Right eye: No discharge.        Left eye: No discharge.     Extraocular Movements: Extraocular movements intact.  Cardiovascular:     Rate and Rhythm: Normal rate and regular rhythm.     Heart sounds: Normal heart sounds. No murmur heard.    No friction rub. No gallop.  Pulmonary:     Effort: Pulmonary effort is normal. No respiratory distress.     Breath sounds: No stridor. Rhonchi (diffuse) present. No wheezing or rales.  Chest:     Chest wall: No tenderness.  Skin:    General: Skin is warm and dry.  Neurological:     General: No focal  deficit present.     Mental Status: She is alert and oriented to person, place, and time.  Psychiatric:        Mood and Affect: Mood normal.        Behavior: Behavior normal.    DG Chest 2 View  Result Date: 08/10/2022 CLINICAL DATA:  Productive cough. EXAM: CHEST - 2 VIEW COMPARISON:  None Available. FINDINGS: The heart size and mediastinal contours are within normal limits. Both lungs are clear. The visualized skeletal structures are unremarkable. IMPRESSION: No active cardiopulmonary disease. Electronically Signed   By: Lupita Raider M.D.   On: 08/10/2022 17:16    Assessment and Plan :   PDMP not reviewed this encounter.  1. Acute bronchitis, unspecified organism   2. Productive cough   3. Chest congestion   4. Tachycardia    Recommended oral prednisone course for management of acute bronchitis.  Given her tachycardia and respiratory symptoms, risk factors of morbid obesity recommended a COVID test.  Unfortunately she is past the window for COVID antivirals  but this COVID test is for precautionary measures in the event that she continues to decline.  Counseled patient on potential for adverse effects with medications prescribed/recommended today, ER and return-to-clinic precautions discussed, patient verbalized understanding.    Wallis Bamberg, PA-C 08/10/22 1734

## 2022-08-10 NOTE — Discharge Instructions (Addendum)
We will notify you of your test results as they arrive and may take between 48-72 hours.  I encourage you to sign up for MyChart if you have not already done so as this can be the easiest way for [place] to communicate results to you online or through a phone app.  Generally, we only contact you if it is a positive test result.  In the meantime, if you develop worsening symptoms including fever, chest pain, shortness of breath despite our current treatment plan then please report to the emergency room as this may be a sign of worsening status from possible viral infection.  Otherwise, we will manage this as a viral bronchitis infection. For sore throat or cough try using a honey-based tea. Use 3 teaspoons of honey with juice squeezed from half lemon. Place shaved pieces of ginger into 1/2-1 cup of water and warm over stove top. Then mix the ingredients and repeat every 4 hours as needed. Please take Tylenol 500mg -650mg  every 6 hours for aches and pains, fevers. Hydrate very well with at least 2 liters of water. Eat light meals such as soups to replenish electrolytes and soft fruits, veggies. Start an antihistamine like Zyrtec for postnasal drainage, sinus congestion.  You can take this together with prednisone and the cough medication.

## 2022-08-11 LAB — SARS CORONAVIRUS 2 (TAT 6-24 HRS): SARS Coronavirus 2: NEGATIVE

## 2022-08-27 ENCOUNTER — Telehealth: Payer: Self-pay

## 2022-08-27 NOTE — Telephone Encounter (Signed)
VOB submitted for Durolane, bilateral knee.  

## 2022-10-27 ENCOUNTER — Other Ambulatory Visit: Payer: Self-pay

## 2022-10-27 ENCOUNTER — Ambulatory Visit (INDEPENDENT_AMBULATORY_CARE_PROVIDER_SITE_OTHER): Payer: Medicare Other | Admitting: Orthopaedic Surgery

## 2022-10-27 DIAGNOSIS — M1711 Unilateral primary osteoarthritis, right knee: Secondary | ICD-10-CM | POA: Diagnosis not present

## 2022-10-27 DIAGNOSIS — M170 Bilateral primary osteoarthritis of knee: Secondary | ICD-10-CM | POA: Diagnosis not present

## 2022-10-27 DIAGNOSIS — M1712 Unilateral primary osteoarthritis, left knee: Secondary | ICD-10-CM

## 2022-10-27 MED ORDER — SODIUM HYALURONATE 60 MG/3ML IX PRSY
60.0000 mg | PREFILLED_SYRINGE | INTRA_ARTICULAR | Status: AC | PRN
  Administered 2022-10-27: 60 mg via INTRA_ARTICULAR

## 2022-10-27 NOTE — Progress Notes (Signed)
   Procedure Note  Patient: Mariah Morrison             Date of Birth: [birth year]           MRN: [id number]             Visit Date: 10/27/2022  Procedures: Visit Diagnoses:  1. Unilateral primary osteoarthritis, left knee   2. Unilateral primary osteoarthritis, right knee     Large Joint Inj: R knee on 10/27/2022 3:04 PM Indications: diagnostic evaluation and pain Details: 22 G 1.5 in needle, superolateral approach  Arthrogram: No  Medications: 60 mg Sodium Hyaluronate 60 MG/3ML Outcome: tolerated well, no immediate complications Procedure, treatment alternatives, risks and benefits explained, specific risks discussed. Consent was given by the patient. Immediately prior to procedure a time out was called to verify the correct patient, procedure, equipment, support staff and site/side marked as required. Patient was prepped and draped in the usual sterile fashion.    Large Joint Inj: L knee on 10/27/2022 3:05 PM Indications: diagnostic evaluation and pain Details: 22 G 1.5 in needle, superolateral approach  Arthrogram: No  Medications: 60 mg Sodium Hyaluronate 60 MG/3ML Outcome: tolerated well, no immediate complications Procedure, treatment alternatives, risks and benefits explained, specific risks discussed. Consent was given by the patient. Immediately prior to procedure a time out was called to verify the correct patient, procedure, equipment, support staff and site/side marked as required. Patient was prepped and draped in the usual sterile fashion.    The patient is here today for bilateral knee hyaluronic acid injections with Durolane to treat the pain from osteoarthritis.  It has been exactly 8 months since she had these injections before and she still states that this helps better than any medications.  She is mainly in a rolling type of chair and has well-documented significant arthritis in both her knees as well as dysplastic hips bilaterally.  There is been no acute change  in medical status.  Both knees still have significant tenderness globally with crepitation as well.  I did place an Durolane in both knees without difficulty.  She knows to wait a minimum of at least 6 months between these types of injections.  Follow-up is as needed.   Lot# [id number]

## 2022-11-30 ENCOUNTER — Encounter: Payer: Self-pay | Admitting: *Deleted

## 2022-11-30 NOTE — Progress Notes (Signed)
[facility] Quality Team Note  Name: Mariah Morrison Date of Birth: 02-Aug-1961 MRN: [id number] Date: 11/30/2022  [facility] Quality Team has reviewed this patient's chart, please see recommendations below:  [facility] Quality Other; (Pt has open gap for mammogram.  Would need completed before end of 2023 to close gap.)

## 2023-02-21 ENCOUNTER — Ambulatory Visit (INDEPENDENT_AMBULATORY_CARE_PROVIDER_SITE_OTHER): Payer: 59 | Admitting: Orthopaedic Surgery

## 2023-02-21 DIAGNOSIS — M1711 Unilateral primary osteoarthritis, right knee: Secondary | ICD-10-CM

## 2023-02-21 DIAGNOSIS — G8929 Other chronic pain: Secondary | ICD-10-CM

## 2023-02-21 DIAGNOSIS — M1712 Unilateral primary osteoarthritis, left knee: Secondary | ICD-10-CM

## 2023-02-21 DIAGNOSIS — M25562 Pain in left knee: Secondary | ICD-10-CM

## 2023-02-21 DIAGNOSIS — M25561 Pain in right knee: Secondary | ICD-10-CM | POA: Diagnosis not present

## 2023-02-21 MED ORDER — METHYLPREDNISOLONE ACETATE 40 MG/ML IJ SUSP
40.0000 mg | INTRAMUSCULAR | Status: AC | PRN
  Administered 2023-02-21: 40 mg via INTRA_ARTICULAR

## 2023-02-21 MED ORDER — LIDOCAINE HCL 1 % IJ SOLN
3.0000 mL | INTRAMUSCULAR | Status: AC | PRN
  Administered 2023-02-21: 3 mL

## 2023-02-21 NOTE — Progress Notes (Signed)
The patient has known significant arthritis of both knees and severe dysplasia of both hips.  We last injected hyaluronic acid in both knees just 3 months ago and she said that did help some.  She is requesting steroid injections in her knees today.  She does ambulate with a rolling walker.  Her walker is worn out and she needs a knee walker so we did give her prescription to take to [facility] supply for a knee rolling walker.  She has had no acute change in her medical status.  Both knees have some swelling but not really effusion type of swelling it is more of her bone shape from her arthritis.  I did place a steroid injection in both knees today which she tolerated well.  Will see her back in 3 months and I would like if possible weight and BMI calculation at that visit.  It has been since 2021 when we x-rayed her knee so we would need new x-rays of both knees 3 months from now.       Procedure Note  Patient: Mariah Morrison             Date of Birth: June 15, 1961           MRN: [id number]             Visit Date: 02/21/2023  Procedures: Visit Diagnoses:  1. Unilateral primary osteoarthritis, left knee   2. Unilateral primary osteoarthritis, right knee   3. Chronic pain of left knee   4. Chronic pain of right knee     Large Joint Inj: R knee on 02/21/2023 2:55 PM Indications: diagnostic evaluation and pain Details: 22 G 1.5 in needle, superolateral approach  Arthrogram: No  Medications: 3 mL lidocaine 1 %; 40 mg methylPREDNISolone acetate 40 MG/ML Outcome: tolerated well, no immediate complications Procedure, treatment alternatives, risks and benefits explained, specific risks discussed. Consent was given by the patient. Immediately prior to procedure a time out was called to verify the correct patient, procedure, equipment, support staff and site/side marked as required. Patient was prepped and draped in the usual sterile fashion.    Large Joint Inj: L knee on 02/21/2023 2:55  PM Indications: diagnostic evaluation and pain Details: 22 G 1.5 in needle, superolateral approach  Arthrogram: No  Medications: 3 mL lidocaine 1 %; 40 mg methylPREDNISolone acetate 40 MG/ML Outcome: tolerated well, no immediate complications Procedure, treatment alternatives, risks and benefits explained, specific risks discussed. Consent was given by the patient. Immediately prior to procedure a time out was called to verify the correct patient, procedure, equipment, support staff and site/side marked as required. Patient was prepped and draped in the usual sterile fashion.

## 2023-03-31 DIAGNOSIS — H35033 Hypertensive retinopathy, bilateral: Secondary | ICD-10-CM | POA: Diagnosis not present

## 2023-03-31 DIAGNOSIS — H52223 Regular astigmatism, bilateral: Secondary | ICD-10-CM | POA: Diagnosis not present

## 2023-03-31 DIAGNOSIS — H5213 Myopia, bilateral: Secondary | ICD-10-CM | POA: Diagnosis not present

## 2023-03-31 DIAGNOSIS — H524 Presbyopia: Secondary | ICD-10-CM | POA: Diagnosis not present

## 2023-03-31 DIAGNOSIS — H2513 Age-related nuclear cataract, bilateral: Secondary | ICD-10-CM | POA: Diagnosis not present

## 2023-05-25 DIAGNOSIS — N132 Hydronephrosis with renal and ureteral calculous obstruction: Secondary | ICD-10-CM | POA: Diagnosis not present

## 2023-05-25 DIAGNOSIS — R579 Shock, unspecified: Secondary | ICD-10-CM | POA: Diagnosis not present

## 2023-05-25 DIAGNOSIS — R000 Tachycardia, unspecified: Secondary | ICD-10-CM | POA: Diagnosis not present

## 2023-05-25 DIAGNOSIS — G9341 Metabolic encephalopathy: Secondary | ICD-10-CM | POA: Diagnosis not present

## 2023-05-25 DIAGNOSIS — N10 Acute tubulo-interstitial nephritis: Secondary | ICD-10-CM | POA: Diagnosis not present

## 2023-05-25 DIAGNOSIS — N390 Urinary tract infection, site not specified: Secondary | ICD-10-CM | POA: Diagnosis not present

## 2023-05-25 DIAGNOSIS — K529 Noninfective gastroenteritis and colitis, unspecified: Secondary | ICD-10-CM | POA: Diagnosis not present

## 2023-05-25 DIAGNOSIS — N201 Calculus of ureter: Secondary | ICD-10-CM | POA: Diagnosis not present

## 2023-05-25 DIAGNOSIS — R0902 Hypoxemia: Secondary | ICD-10-CM | POA: Diagnosis not present

## 2023-05-25 DIAGNOSIS — R778 Other specified abnormalities of plasma proteins: Secondary | ICD-10-CM | POA: Diagnosis not present

## 2023-05-25 DIAGNOSIS — R6889 Other general symptoms and signs: Secondary | ICD-10-CM | POA: Diagnosis not present

## 2023-05-25 DIAGNOSIS — I499 Cardiac arrhythmia, unspecified: Secondary | ICD-10-CM | POA: Diagnosis not present

## 2023-05-25 DIAGNOSIS — J9811 Atelectasis: Secondary | ICD-10-CM | POA: Diagnosis not present

## 2023-05-25 DIAGNOSIS — M162 Bilateral osteoarthritis resulting from hip dysplasia: Secondary | ICD-10-CM | POA: Diagnosis not present

## 2023-05-25 DIAGNOSIS — T40711A Poisoning by cannabis, accidental (unintentional), initial encounter: Secondary | ICD-10-CM | POA: Diagnosis not present

## 2023-05-25 DIAGNOSIS — K760 Fatty (change of) liver, not elsewhere classified: Secondary | ICD-10-CM | POA: Diagnosis not present

## 2023-05-25 DIAGNOSIS — I10 Essential (primary) hypertension: Secondary | ICD-10-CM | POA: Diagnosis not present

## 2023-05-25 DIAGNOSIS — R6521 Severe sepsis with septic shock: Secondary | ICD-10-CM | POA: Diagnosis not present

## 2023-05-25 DIAGNOSIS — R7303 Prediabetes: Secondary | ICD-10-CM | POA: Diagnosis not present

## 2023-05-25 DIAGNOSIS — J9690 Respiratory failure, unspecified, unspecified whether with hypoxia or hypercapnia: Secondary | ICD-10-CM | POA: Diagnosis not present

## 2023-05-25 DIAGNOSIS — T426X1A Poisoning by other antiepileptic and sedative-hypnotic drugs, accidental (unintentional), initial encounter: Secondary | ICD-10-CM | POA: Diagnosis not present

## 2023-05-25 DIAGNOSIS — N179 Acute kidney failure, unspecified: Secondary | ICD-10-CM | POA: Diagnosis not present

## 2023-05-25 DIAGNOSIS — Z7409 Other reduced mobility: Secondary | ICD-10-CM | POA: Diagnosis not present

## 2023-05-25 DIAGNOSIS — T426X5A Adverse effect of other antiepileptic and sedative-hypnotic drugs, initial encounter: Secondary | ICD-10-CM | POA: Diagnosis not present

## 2023-05-25 DIAGNOSIS — G8929 Other chronic pain: Secondary | ICD-10-CM | POA: Diagnosis not present

## 2023-05-25 DIAGNOSIS — N136 Pyonephrosis: Secondary | ICD-10-CM | POA: Diagnosis not present

## 2023-05-25 DIAGNOSIS — A419 Sepsis, unspecified organism: Secondary | ICD-10-CM | POA: Diagnosis not present

## 2023-05-25 DIAGNOSIS — I871 Compression of vein: Secondary | ICD-10-CM | POA: Diagnosis not present

## 2023-05-25 DIAGNOSIS — B952 Enterococcus as the cause of diseases classified elsewhere: Secondary | ICD-10-CM | POA: Diagnosis not present

## 2023-05-25 DIAGNOSIS — J9601 Acute respiratory failure with hypoxia: Secondary | ICD-10-CM | POA: Diagnosis not present

## 2023-05-25 DIAGNOSIS — R0602 Shortness of breath: Secondary | ICD-10-CM | POA: Diagnosis not present

## 2023-05-25 DIAGNOSIS — J811 Chronic pulmonary edema: Secondary | ICD-10-CM | POA: Diagnosis not present

## 2023-05-25 DIAGNOSIS — N200 Calculus of kidney: Secondary | ICD-10-CM | POA: Diagnosis not present

## 2023-05-25 DIAGNOSIS — Z9911 Dependence on respirator [ventilator] status: Secondary | ICD-10-CM | POA: Diagnosis not present

## 2023-05-25 DIAGNOSIS — J90 Pleural effusion, not elsewhere classified: Secondary | ICD-10-CM | POA: Diagnosis not present

## 2023-05-25 DIAGNOSIS — R918 Other nonspecific abnormal finding of lung field: Secondary | ICD-10-CM | POA: Diagnosis not present

## 2023-05-25 DIAGNOSIS — Z79899 Other long term (current) drug therapy: Secondary | ICD-10-CM | POA: Diagnosis not present

## 2023-05-25 DIAGNOSIS — Z781 Physical restraint status: Secondary | ICD-10-CM | POA: Diagnosis not present

## 2023-05-25 DIAGNOSIS — G928 Other toxic encephalopathy: Secondary | ICD-10-CM | POA: Diagnosis not present

## 2023-05-25 DIAGNOSIS — R569 Unspecified convulsions: Secondary | ICD-10-CM | POA: Diagnosis not present

## 2023-05-25 DIAGNOSIS — I517 Cardiomegaly: Secondary | ICD-10-CM | POA: Diagnosis not present

## 2023-05-25 DIAGNOSIS — I878 Other specified disorders of veins: Secondary | ICD-10-CM | POA: Diagnosis not present

## 2023-05-25 DIAGNOSIS — Z4659 Encounter for fitting and adjustment of other gastrointestinal appliance and device: Secondary | ICD-10-CM | POA: Diagnosis not present

## 2023-05-25 DIAGNOSIS — I952 Hypotension due to drugs: Secondary | ICD-10-CM | POA: Diagnosis not present

## 2023-05-25 DIAGNOSIS — T40711D Poisoning by cannabis, accidental (unintentional), subsequent encounter: Secondary | ICD-10-CM | POA: Diagnosis not present

## 2023-05-25 DIAGNOSIS — R1310 Dysphagia, unspecified: Secondary | ICD-10-CM | POA: Diagnosis not present

## 2023-05-25 DIAGNOSIS — I2489 Other forms of acute ischemic heart disease: Secondary | ICD-10-CM | POA: Diagnosis not present

## 2023-05-25 DIAGNOSIS — E872 Acidosis, unspecified: Secondary | ICD-10-CM | POA: Diagnosis not present

## 2023-05-26 ENCOUNTER — Ambulatory Visit: Payer: 59 | Admitting: Orthopaedic Surgery

## 2023-05-26 DIAGNOSIS — J811 Chronic pulmonary edema: Secondary | ICD-10-CM | POA: Diagnosis not present

## 2023-05-26 DIAGNOSIS — I878 Other specified disorders of veins: Secondary | ICD-10-CM | POA: Diagnosis not present

## 2023-05-26 DIAGNOSIS — J90 Pleural effusion, not elsewhere classified: Secondary | ICD-10-CM | POA: Diagnosis not present

## 2023-05-26 DIAGNOSIS — R000 Tachycardia, unspecified: Secondary | ICD-10-CM | POA: Diagnosis not present

## 2023-05-26 DIAGNOSIS — I499 Cardiac arrhythmia, unspecified: Secondary | ICD-10-CM | POA: Diagnosis not present

## 2023-05-26 DIAGNOSIS — N201 Calculus of ureter: Secondary | ICD-10-CM | POA: Diagnosis not present

## 2023-05-26 DIAGNOSIS — J9811 Atelectasis: Secondary | ICD-10-CM | POA: Diagnosis not present

## 2023-05-26 DIAGNOSIS — I517 Cardiomegaly: Secondary | ICD-10-CM | POA: Diagnosis not present

## 2023-05-27 ENCOUNTER — Encounter: Payer: 59 | Admitting: Adult Health

## 2023-05-27 DIAGNOSIS — J9690 Respiratory failure, unspecified, unspecified whether with hypoxia or hypercapnia: Secondary | ICD-10-CM | POA: Diagnosis not present

## 2023-05-27 DIAGNOSIS — J90 Pleural effusion, not elsewhere classified: Secondary | ICD-10-CM | POA: Diagnosis not present

## 2023-05-27 NOTE — Progress Notes (Signed)
This encounter was created in error - please disregard.

## 2023-05-28 DIAGNOSIS — A419 Sepsis, unspecified organism: Secondary | ICD-10-CM | POA: Diagnosis not present

## 2023-05-28 DIAGNOSIS — R6521 Severe sepsis with septic shock: Secondary | ICD-10-CM | POA: Diagnosis not present

## 2023-05-29 DIAGNOSIS — J90 Pleural effusion, not elsewhere classified: Secondary | ICD-10-CM | POA: Diagnosis not present

## 2023-05-29 DIAGNOSIS — N132 Hydronephrosis with renal and ureteral calculous obstruction: Secondary | ICD-10-CM | POA: Diagnosis not present

## 2023-05-29 DIAGNOSIS — R6521 Severe sepsis with septic shock: Secondary | ICD-10-CM | POA: Diagnosis not present

## 2023-05-29 DIAGNOSIS — N201 Calculus of ureter: Secondary | ICD-10-CM | POA: Diagnosis not present

## 2023-05-29 DIAGNOSIS — J9601 Acute respiratory failure with hypoxia: Secondary | ICD-10-CM | POA: Diagnosis not present

## 2023-05-29 DIAGNOSIS — A419 Sepsis, unspecified organism: Secondary | ICD-10-CM | POA: Diagnosis not present

## 2023-05-30 DIAGNOSIS — G9341 Metabolic encephalopathy: Secondary | ICD-10-CM | POA: Diagnosis not present

## 2023-05-30 DIAGNOSIS — N201 Calculus of ureter: Secondary | ICD-10-CM | POA: Diagnosis not present

## 2023-05-30 DIAGNOSIS — I499 Cardiac arrhythmia, unspecified: Secondary | ICD-10-CM | POA: Diagnosis not present

## 2023-05-31 DIAGNOSIS — R6521 Severe sepsis with septic shock: Secondary | ICD-10-CM | POA: Diagnosis not present

## 2023-05-31 DIAGNOSIS — J9601 Acute respiratory failure with hypoxia: Secondary | ICD-10-CM | POA: Diagnosis not present

## 2023-05-31 DIAGNOSIS — G9341 Metabolic encephalopathy: Secondary | ICD-10-CM | POA: Diagnosis not present

## 2023-05-31 DIAGNOSIS — N201 Calculus of ureter: Secondary | ICD-10-CM | POA: Diagnosis not present

## 2023-05-31 DIAGNOSIS — A419 Sepsis, unspecified organism: Secondary | ICD-10-CM | POA: Diagnosis not present

## 2023-05-31 DIAGNOSIS — R569 Unspecified convulsions: Secondary | ICD-10-CM | POA: Diagnosis not present

## 2023-06-01 DIAGNOSIS — J9601 Acute respiratory failure with hypoxia: Secondary | ICD-10-CM | POA: Diagnosis not present

## 2023-06-01 DIAGNOSIS — N201 Calculus of ureter: Secondary | ICD-10-CM | POA: Diagnosis not present

## 2023-06-01 DIAGNOSIS — A419 Sepsis, unspecified organism: Secondary | ICD-10-CM | POA: Diagnosis not present

## 2023-06-01 DIAGNOSIS — R6521 Severe sepsis with septic shock: Secondary | ICD-10-CM | POA: Diagnosis not present

## 2023-06-02 DIAGNOSIS — N201 Calculus of ureter: Secondary | ICD-10-CM | POA: Diagnosis not present

## 2023-06-02 DIAGNOSIS — J9601 Acute respiratory failure with hypoxia: Secondary | ICD-10-CM | POA: Diagnosis not present

## 2023-06-02 DIAGNOSIS — R6521 Severe sepsis with septic shock: Secondary | ICD-10-CM | POA: Diagnosis not present

## 2023-06-02 DIAGNOSIS — A419 Sepsis, unspecified organism: Secondary | ICD-10-CM | POA: Diagnosis not present

## 2023-06-03 DIAGNOSIS — R6521 Severe sepsis with septic shock: Secondary | ICD-10-CM | POA: Diagnosis not present

## 2023-06-03 DIAGNOSIS — N10 Acute tubulo-interstitial nephritis: Secondary | ICD-10-CM | POA: Diagnosis not present

## 2023-06-03 DIAGNOSIS — J9601 Acute respiratory failure with hypoxia: Secondary | ICD-10-CM | POA: Diagnosis not present

## 2023-06-03 DIAGNOSIS — N132 Hydronephrosis with renal and ureteral calculous obstruction: Secondary | ICD-10-CM | POA: Diagnosis not present

## 2023-06-03 DIAGNOSIS — N390 Urinary tract infection, site not specified: Secondary | ICD-10-CM | POA: Diagnosis not present

## 2023-06-03 DIAGNOSIS — A419 Sepsis, unspecified organism: Secondary | ICD-10-CM | POA: Diagnosis not present

## 2023-06-04 DIAGNOSIS — A419 Sepsis, unspecified organism: Secondary | ICD-10-CM | POA: Diagnosis not present

## 2023-06-04 DIAGNOSIS — R6521 Severe sepsis with septic shock: Secondary | ICD-10-CM | POA: Diagnosis not present

## 2023-06-05 DIAGNOSIS — A419 Sepsis, unspecified organism: Secondary | ICD-10-CM | POA: Diagnosis not present

## 2023-06-05 DIAGNOSIS — R6521 Severe sepsis with septic shock: Secondary | ICD-10-CM | POA: Diagnosis not present

## 2023-06-06 DIAGNOSIS — R6521 Severe sepsis with septic shock: Secondary | ICD-10-CM | POA: Diagnosis not present

## 2023-06-06 DIAGNOSIS — A419 Sepsis, unspecified organism: Secondary | ICD-10-CM | POA: Diagnosis not present

## 2023-06-07 DIAGNOSIS — I2489 Other forms of acute ischemic heart disease: Secondary | ICD-10-CM | POA: Diagnosis not present

## 2023-06-07 DIAGNOSIS — N390 Urinary tract infection, site not specified: Secondary | ICD-10-CM | POA: Diagnosis not present

## 2023-06-07 DIAGNOSIS — K219 Gastro-esophageal reflux disease without esophagitis: Secondary | ICD-10-CM | POA: Diagnosis not present

## 2023-06-07 DIAGNOSIS — R569 Unspecified convulsions: Secondary | ICD-10-CM | POA: Diagnosis not present

## 2023-06-07 DIAGNOSIS — R5381 Other malaise: Secondary | ICD-10-CM | POA: Diagnosis not present

## 2023-06-07 DIAGNOSIS — N132 Hydronephrosis with renal and ureteral calculous obstruction: Secondary | ICD-10-CM | POA: Diagnosis not present

## 2023-06-07 DIAGNOSIS — N201 Calculus of ureter: Secondary | ICD-10-CM | POA: Diagnosis not present

## 2023-06-07 DIAGNOSIS — R7881 Bacteremia: Secondary | ICD-10-CM | POA: Diagnosis not present

## 2023-06-07 DIAGNOSIS — J9601 Acute respiratory failure with hypoxia: Secondary | ICD-10-CM | POA: Diagnosis not present

## 2023-06-07 DIAGNOSIS — Z7409 Other reduced mobility: Secondary | ICD-10-CM | POA: Diagnosis not present

## 2023-06-07 DIAGNOSIS — R1310 Dysphagia, unspecified: Secondary | ICD-10-CM | POA: Diagnosis not present

## 2023-06-07 DIAGNOSIS — M25512 Pain in left shoulder: Secondary | ICD-10-CM | POA: Diagnosis not present

## 2023-06-07 DIAGNOSIS — I2481 Acute coronary microvascular dysfunction: Secondary | ICD-10-CM | POA: Diagnosis not present

## 2023-06-07 DIAGNOSIS — J90 Pleural effusion, not elsewhere classified: Secondary | ICD-10-CM | POA: Diagnosis not present

## 2023-06-07 DIAGNOSIS — G9341 Metabolic encephalopathy: Secondary | ICD-10-CM | POA: Diagnosis not present

## 2023-06-07 DIAGNOSIS — R6521 Severe sepsis with septic shock: Secondary | ICD-10-CM | POA: Diagnosis not present

## 2023-06-07 DIAGNOSIS — E878 Other disorders of electrolyte and fluid balance, not elsewhere classified: Secondary | ICD-10-CM | POA: Diagnosis not present

## 2023-06-07 DIAGNOSIS — T40711D Poisoning by cannabis, accidental (unintentional), subsequent encounter: Secondary | ICD-10-CM | POA: Diagnosis not present

## 2023-06-07 DIAGNOSIS — A419 Sepsis, unspecified organism: Secondary | ICD-10-CM | POA: Diagnosis not present

## 2023-06-08 DIAGNOSIS — N201 Calculus of ureter: Secondary | ICD-10-CM | POA: Diagnosis not present

## 2023-06-08 DIAGNOSIS — J9601 Acute respiratory failure with hypoxia: Secondary | ICD-10-CM | POA: Diagnosis not present

## 2023-06-08 DIAGNOSIS — R7881 Bacteremia: Secondary | ICD-10-CM | POA: Diagnosis not present

## 2023-06-08 DIAGNOSIS — I2489 Other forms of acute ischemic heart disease: Secondary | ICD-10-CM | POA: Diagnosis not present

## 2023-06-08 DIAGNOSIS — J90 Pleural effusion, not elsewhere classified: Secondary | ICD-10-CM | POA: Diagnosis not present

## 2023-06-08 DIAGNOSIS — I2481 Acute coronary microvascular dysfunction: Secondary | ICD-10-CM | POA: Diagnosis not present

## 2023-06-08 DIAGNOSIS — A419 Sepsis, unspecified organism: Secondary | ICD-10-CM | POA: Diagnosis not present

## 2023-06-09 DIAGNOSIS — R569 Unspecified convulsions: Secondary | ICD-10-CM | POA: Diagnosis not present

## 2023-06-09 DIAGNOSIS — R7881 Bacteremia: Secondary | ICD-10-CM | POA: Diagnosis not present

## 2023-06-09 DIAGNOSIS — J90 Pleural effusion, not elsewhere classified: Secondary | ICD-10-CM | POA: Diagnosis not present

## 2023-06-09 DIAGNOSIS — R5381 Other malaise: Secondary | ICD-10-CM | POA: Diagnosis not present

## 2023-06-09 DIAGNOSIS — G9341 Metabolic encephalopathy: Secondary | ICD-10-CM | POA: Diagnosis not present

## 2023-06-09 DIAGNOSIS — N201 Calculus of ureter: Secondary | ICD-10-CM | POA: Diagnosis not present

## 2023-06-09 DIAGNOSIS — E878 Other disorders of electrolyte and fluid balance, not elsewhere classified: Secondary | ICD-10-CM | POA: Diagnosis not present

## 2023-06-09 DIAGNOSIS — I2489 Other forms of acute ischemic heart disease: Secondary | ICD-10-CM | POA: Diagnosis not present

## 2023-06-10 DIAGNOSIS — G9341 Metabolic encephalopathy: Secondary | ICD-10-CM | POA: Diagnosis not present

## 2023-06-16 DIAGNOSIS — M25512 Pain in left shoulder: Secondary | ICD-10-CM | POA: Diagnosis not present

## 2023-06-16 DIAGNOSIS — E878 Other disorders of electrolyte and fluid balance, not elsewhere classified: Secondary | ICD-10-CM | POA: Diagnosis not present

## 2023-06-16 DIAGNOSIS — R569 Unspecified convulsions: Secondary | ICD-10-CM | POA: Diagnosis not present

## 2023-06-16 DIAGNOSIS — I2489 Other forms of acute ischemic heart disease: Secondary | ICD-10-CM | POA: Diagnosis not present

## 2023-06-16 DIAGNOSIS — R7881 Bacteremia: Secondary | ICD-10-CM | POA: Diagnosis not present

## 2023-06-16 DIAGNOSIS — G9341 Metabolic encephalopathy: Secondary | ICD-10-CM | POA: Diagnosis not present

## 2023-06-16 DIAGNOSIS — J90 Pleural effusion, not elsewhere classified: Secondary | ICD-10-CM | POA: Diagnosis not present

## 2023-06-16 DIAGNOSIS — N201 Calculus of ureter: Secondary | ICD-10-CM | POA: Diagnosis not present

## 2023-06-16 DIAGNOSIS — K219 Gastro-esophageal reflux disease without esophagitis: Secondary | ICD-10-CM | POA: Diagnosis not present

## 2023-06-16 DIAGNOSIS — J9601 Acute respiratory failure with hypoxia: Secondary | ICD-10-CM | POA: Diagnosis not present

## 2023-06-16 DIAGNOSIS — R5381 Other malaise: Secondary | ICD-10-CM | POA: Diagnosis not present

## 2023-07-04 DIAGNOSIS — Z9181 History of falling: Secondary | ICD-10-CM | POA: Diagnosis not present

## 2023-07-04 DIAGNOSIS — I2489 Other forms of acute ischemic heart disease: Secondary | ICD-10-CM | POA: Diagnosis not present

## 2023-07-04 DIAGNOSIS — K5289 Other specified noninfective gastroenteritis and colitis: Secondary | ICD-10-CM | POA: Diagnosis not present

## 2023-07-04 DIAGNOSIS — G9341 Metabolic encephalopathy: Secondary | ICD-10-CM | POA: Diagnosis not present

## 2023-07-04 DIAGNOSIS — R1310 Dysphagia, unspecified: Secondary | ICD-10-CM | POA: Diagnosis not present

## 2023-07-04 DIAGNOSIS — J90 Pleural effusion, not elsewhere classified: Secondary | ICD-10-CM | POA: Diagnosis not present

## 2023-07-04 DIAGNOSIS — Z960 Presence of urogenital implants: Secondary | ICD-10-CM | POA: Diagnosis not present

## 2023-07-04 DIAGNOSIS — E1165 Type 2 diabetes mellitus with hyperglycemia: Secondary | ICD-10-CM | POA: Diagnosis not present

## 2023-07-04 DIAGNOSIS — N390 Urinary tract infection, site not specified: Secondary | ICD-10-CM | POA: Diagnosis not present

## 2023-07-04 DIAGNOSIS — M1990 Unspecified osteoarthritis, unspecified site: Secondary | ICD-10-CM | POA: Diagnosis not present

## 2023-07-06 ENCOUNTER — Ambulatory Visit: Payer: 59 | Admitting: Orthopaedic Surgery

## 2023-07-06 DIAGNOSIS — I2489 Other forms of acute ischemic heart disease: Secondary | ICD-10-CM | POA: Diagnosis not present

## 2023-07-06 DIAGNOSIS — E1165 Type 2 diabetes mellitus with hyperglycemia: Secondary | ICD-10-CM | POA: Diagnosis not present

## 2023-07-06 DIAGNOSIS — M1990 Unspecified osteoarthritis, unspecified site: Secondary | ICD-10-CM | POA: Diagnosis not present

## 2023-07-06 DIAGNOSIS — Z960 Presence of urogenital implants: Secondary | ICD-10-CM | POA: Diagnosis not present

## 2023-07-06 DIAGNOSIS — G9341 Metabolic encephalopathy: Secondary | ICD-10-CM | POA: Diagnosis not present

## 2023-07-06 DIAGNOSIS — K5289 Other specified noninfective gastroenteritis and colitis: Secondary | ICD-10-CM | POA: Diagnosis not present

## 2023-07-06 DIAGNOSIS — J90 Pleural effusion, not elsewhere classified: Secondary | ICD-10-CM | POA: Diagnosis not present

## 2023-07-06 DIAGNOSIS — Z9181 History of falling: Secondary | ICD-10-CM | POA: Diagnosis not present

## 2023-07-06 DIAGNOSIS — R1310 Dysphagia, unspecified: Secondary | ICD-10-CM | POA: Diagnosis not present

## 2023-07-06 DIAGNOSIS — N390 Urinary tract infection, site not specified: Secondary | ICD-10-CM | POA: Diagnosis not present

## 2023-07-07 DIAGNOSIS — Z960 Presence of urogenital implants: Secondary | ICD-10-CM | POA: Diagnosis not present

## 2023-07-07 DIAGNOSIS — R1310 Dysphagia, unspecified: Secondary | ICD-10-CM | POA: Diagnosis not present

## 2023-07-07 DIAGNOSIS — Z9181 History of falling: Secondary | ICD-10-CM | POA: Diagnosis not present

## 2023-07-07 DIAGNOSIS — M1990 Unspecified osteoarthritis, unspecified site: Secondary | ICD-10-CM | POA: Diagnosis not present

## 2023-07-07 DIAGNOSIS — N390 Urinary tract infection, site not specified: Secondary | ICD-10-CM | POA: Diagnosis not present

## 2023-07-07 DIAGNOSIS — K5289 Other specified noninfective gastroenteritis and colitis: Secondary | ICD-10-CM | POA: Diagnosis not present

## 2023-07-07 DIAGNOSIS — J90 Pleural effusion, not elsewhere classified: Secondary | ICD-10-CM | POA: Diagnosis not present

## 2023-07-07 DIAGNOSIS — E1165 Type 2 diabetes mellitus with hyperglycemia: Secondary | ICD-10-CM | POA: Diagnosis not present

## 2023-07-07 DIAGNOSIS — G9341 Metabolic encephalopathy: Secondary | ICD-10-CM | POA: Diagnosis not present

## 2023-07-07 DIAGNOSIS — I2489 Other forms of acute ischemic heart disease: Secondary | ICD-10-CM | POA: Diagnosis not present

## 2023-07-11 DIAGNOSIS — K5289 Other specified noninfective gastroenteritis and colitis: Secondary | ICD-10-CM | POA: Diagnosis not present

## 2023-07-11 DIAGNOSIS — G9341 Metabolic encephalopathy: Secondary | ICD-10-CM | POA: Diagnosis not present

## 2023-07-11 DIAGNOSIS — N390 Urinary tract infection, site not specified: Secondary | ICD-10-CM | POA: Diagnosis not present

## 2023-07-11 DIAGNOSIS — J90 Pleural effusion, not elsewhere classified: Secondary | ICD-10-CM | POA: Diagnosis not present

## 2023-07-11 DIAGNOSIS — E1165 Type 2 diabetes mellitus with hyperglycemia: Secondary | ICD-10-CM | POA: Diagnosis not present

## 2023-07-11 DIAGNOSIS — Z960 Presence of urogenital implants: Secondary | ICD-10-CM | POA: Diagnosis not present

## 2023-07-11 DIAGNOSIS — M1990 Unspecified osteoarthritis, unspecified site: Secondary | ICD-10-CM | POA: Diagnosis not present

## 2023-07-11 DIAGNOSIS — Z9181 History of falling: Secondary | ICD-10-CM | POA: Diagnosis not present

## 2023-07-11 DIAGNOSIS — I2489 Other forms of acute ischemic heart disease: Secondary | ICD-10-CM | POA: Diagnosis not present

## 2023-07-11 DIAGNOSIS — R1310 Dysphagia, unspecified: Secondary | ICD-10-CM | POA: Diagnosis not present

## 2023-07-12 DIAGNOSIS — K5289 Other specified noninfective gastroenteritis and colitis: Secondary | ICD-10-CM | POA: Diagnosis not present

## 2023-07-12 DIAGNOSIS — M1990 Unspecified osteoarthritis, unspecified site: Secondary | ICD-10-CM | POA: Diagnosis not present

## 2023-07-12 DIAGNOSIS — J90 Pleural effusion, not elsewhere classified: Secondary | ICD-10-CM | POA: Diagnosis not present

## 2023-07-12 DIAGNOSIS — G9341 Metabolic encephalopathy: Secondary | ICD-10-CM | POA: Diagnosis not present

## 2023-07-12 DIAGNOSIS — I2489 Other forms of acute ischemic heart disease: Secondary | ICD-10-CM | POA: Diagnosis not present

## 2023-07-12 DIAGNOSIS — E1165 Type 2 diabetes mellitus with hyperglycemia: Secondary | ICD-10-CM | POA: Diagnosis not present

## 2023-07-12 DIAGNOSIS — Z9181 History of falling: Secondary | ICD-10-CM | POA: Diagnosis not present

## 2023-07-12 DIAGNOSIS — Z960 Presence of urogenital implants: Secondary | ICD-10-CM | POA: Diagnosis not present

## 2023-07-12 DIAGNOSIS — R1310 Dysphagia, unspecified: Secondary | ICD-10-CM | POA: Diagnosis not present

## 2023-07-12 DIAGNOSIS — N390 Urinary tract infection, site not specified: Secondary | ICD-10-CM | POA: Diagnosis not present

## 2023-07-14 DIAGNOSIS — N390 Urinary tract infection, site not specified: Secondary | ICD-10-CM | POA: Diagnosis not present

## 2023-07-14 DIAGNOSIS — K5289 Other specified noninfective gastroenteritis and colitis: Secondary | ICD-10-CM | POA: Diagnosis not present

## 2023-07-14 DIAGNOSIS — R1310 Dysphagia, unspecified: Secondary | ICD-10-CM | POA: Diagnosis not present

## 2023-07-14 DIAGNOSIS — Z960 Presence of urogenital implants: Secondary | ICD-10-CM | POA: Diagnosis not present

## 2023-07-14 DIAGNOSIS — M1990 Unspecified osteoarthritis, unspecified site: Secondary | ICD-10-CM | POA: Diagnosis not present

## 2023-07-14 DIAGNOSIS — G9341 Metabolic encephalopathy: Secondary | ICD-10-CM | POA: Diagnosis not present

## 2023-07-14 DIAGNOSIS — I2489 Other forms of acute ischemic heart disease: Secondary | ICD-10-CM | POA: Diagnosis not present

## 2023-07-14 DIAGNOSIS — J90 Pleural effusion, not elsewhere classified: Secondary | ICD-10-CM | POA: Diagnosis not present

## 2023-07-14 DIAGNOSIS — E1165 Type 2 diabetes mellitus with hyperglycemia: Secondary | ICD-10-CM | POA: Diagnosis not present

## 2023-07-14 DIAGNOSIS — Z9181 History of falling: Secondary | ICD-10-CM | POA: Diagnosis not present

## 2023-07-15 ENCOUNTER — Encounter: Payer: Self-pay | Admitting: Adult Health

## 2023-07-15 ENCOUNTER — Ambulatory Visit (INDEPENDENT_AMBULATORY_CARE_PROVIDER_SITE_OTHER): Payer: 59 | Admitting: Adult Health

## 2023-07-15 ENCOUNTER — Other Ambulatory Visit: Payer: Self-pay | Admitting: Adult Health

## 2023-07-15 ENCOUNTER — Telehealth: Payer: Self-pay

## 2023-07-15 VITALS — BP 136/78 | HR 103 | Temp 97.2°F | Resp 18 | Ht 65.0 in | Wt 263.6 lb

## 2023-07-15 DIAGNOSIS — R531 Weakness: Secondary | ICD-10-CM

## 2023-07-15 DIAGNOSIS — Z113 Encounter for screening for infections with a predominantly sexual mode of transmission: Secondary | ICD-10-CM

## 2023-07-15 DIAGNOSIS — R8290 Unspecified abnormal findings in urine: Secondary | ICD-10-CM | POA: Diagnosis not present

## 2023-07-15 DIAGNOSIS — R32 Unspecified urinary incontinence: Secondary | ICD-10-CM

## 2023-07-15 DIAGNOSIS — N390 Urinary tract infection, site not specified: Secondary | ICD-10-CM | POA: Diagnosis not present

## 2023-07-15 DIAGNOSIS — R000 Tachycardia, unspecified: Secondary | ICD-10-CM

## 2023-07-15 DIAGNOSIS — N8110 Cystocele, unspecified: Secondary | ICD-10-CM

## 2023-07-15 DIAGNOSIS — Z7689 Persons encountering health services in other specified circumstances: Secondary | ICD-10-CM | POA: Diagnosis not present

## 2023-07-15 DIAGNOSIS — Z1231 Encounter for screening mammogram for malignant neoplasm of breast: Secondary | ICD-10-CM

## 2023-07-15 DIAGNOSIS — Z0000 Encounter for general adult medical examination without abnormal findings: Secondary | ICD-10-CM | POA: Diagnosis not present

## 2023-07-15 LAB — POCT URINALYSIS DIPSTICK
Glucose, UA: NEGATIVE
Ketones, UA: POSITIVE
Nitrite, UA: POSITIVE
Protein, UA: POSITIVE — AB
Spec Grav, UA: 1.015 (ref 1.010–1.025)
Urobilinogen, UA: NEGATIVE E.U./dL — AB
pH, UA: 6.5 (ref 5.0–8.0)

## 2023-07-15 MED ORDER — SULFAMETHOXAZOLE-TRIMETHOPRIM 800-160 MG PO TABS
1.0000 | ORAL_TABLET | Freq: Two times a day (BID) | ORAL | 0 refills | Status: AC
Start: 2023-07-15 — End: 2023-07-22

## 2023-07-15 NOTE — Progress Notes (Unsigned)
[facility] clinic  Provider:  Avanell Shackleton Medina-Vargas  Code Status:  Full Code  Goals of Care:     07/15/2023    9:19 AM  Advanced Directives  Does Patient Have a Medical Advance Directive? No  Would patient like information on creating a medical advance directive? No - Patient declined     Chief Complaint  Patient presents with   Establish Care    New patient to establish care.    HPI: Patient is a 62 y.o. female seen today to establish care with [facility]. She was accompanied today by her daughter. She is a widow. She completed high school and worked as a Investment banker, operational. She lives with her daughter, Tamera Punt. She has 2 daughters and a son. She was recently hospitalized for septic shock secondary to UTI and was discharged on 06/07/2023.She discharged to [facility] for short-term rehabilitation X 2 weeks. She is currently having Home health PT and OT at home. Daughter is requesting for a personal care assistant to help with patient's ADL. Per record that daughter showed in her phone, patient has an 8 mm left UPJ stone. She was reported to have renal stenting done during her last hospitalization. She has an appointment with with urology on 07/18/23. Daughter stated that her urine has a strong smell. She has urinary incontinence since after diagnosis of urinary stones. She has a bladder prolapse. Daughter is requesting referral to [facility]. She follows up with orthopedics, Dr. Magnus Ivan, for bilateral hip dysplasia.  POC urine dipstick showed  large leukocyte, positive nitrates, positive protein, large blood and positive ketones.  Past Medical History:  Diagnosis Date   Anemia    w/pregnancy, no current problems   Arthritis    severe   History of kidney stones    passed stones    Past Surgical History:  Procedure Laterality Date   ABDOMINAL HYSTERECTOMY  06/07/2014   COLONOSCOPY      Allergies  Allergen Reactions   No Known Allergies     Outpatient Encounter Medications as of  07/15/2023  Medication Sig   acetaminophen (TYLENOL) 650 MG CR tablet Take 650 mg by mouth every 8 (eight) hours as needed for pain.   ibuprofen (ADVIL) 200 MG tablet Take 200 mg by mouth every 6 (six) hours as needed for mild pain or moderate pain. Motrin   Menthol-Camphor (TIGER BALM ARTHRITIS RUB) 11-11 % CREA Apply 1 application topically daily as needed (pain).   naproxen sodium (ALEVE) 220 MG tablet Take 220 mg by mouth daily as needed (Pain).   [DISCONTINUED] predniSONE (DELTASONE) 20 MG tablet Take 2 tablets daily with breakfast.   [DISCONTINUED] promethazine-dextromethorphan (PROMETHAZINE-DM) 6.25-15 MG/5ML syrup Take 5 mLs by mouth 3 (three) times daily as needed for cough.   No facility-administered encounter medications on file as of 07/15/2023.    Review of Systems:  Review of Systems  Constitutional:  Negative for appetite change, chills, fatigue and fever.  HENT:  Negative for congestion, hearing loss, rhinorrhea and sore throat.   Eyes: Negative.   Respiratory:  Negative for cough, shortness of breath and wheezing.   Cardiovascular:  Negative for chest pain, palpitations and leg swelling.  Gastrointestinal:  Negative for abdominal pain, constipation, diarrhea, nausea and vomiting.  Genitourinary:  Negative for dysuria.  Musculoskeletal:  Negative for arthralgias, back pain and myalgias.  Skin:  Negative for color change, rash and wound.  Neurological:  Negative for dizziness, weakness and headaches.  Psychiatric/Behavioral:  Negative for behavioral problems. The patient  is not nervous/anxious.     Health Maintenance  Topic Date Due   Medicare Annual Wellness (AWV)  Never done   HIV Screening  Never done   Hepatitis C Screening  Never done   DTaP/Tdap/Td (1 - Tdap) Never done   MAMMOGRAM  Never done   Zoster Vaccines- Shingrix (1 of 2) Never done   PAP SMEAR-Modifier  05/13/2017   COVID-19 Vaccine (1 - 2023-24 season) Never done   INFLUENZA VACCINE  07/28/2023    Colonoscopy  09/04/2024   HPV VACCINES  Aged Out    Physical Exam: Vitals:   07/15/23 0920  BP: 136/78  Pulse: (!) 112  Resp: 18  Temp: (!) 97.2 F (36.2 C)  SpO2: 93%  Weight: 263 lb 9.6 oz (119.6 kg)  Height: 5\' 5"  (1.651 m)   Body mass index is 43.87 kg/m. Physical Exam Constitutional:      Appearance: She is obese.     Comments: Morbidly obese  HENT:     Head: Normocephalic and atraumatic.     Nose: Nose normal.     Mouth/Throat:     Mouth: Mucous membranes are moist.  Eyes:     Conjunctiva/sclera: Conjunctivae normal.  Cardiovascular:     Rate and Rhythm: Normal rate and regular rhythm.  Pulmonary:     Effort: Pulmonary effort is normal.     Breath sounds: Normal breath sounds.  Abdominal:     General: Bowel sounds are normal.     Palpations: Abdomen is soft.  Musculoskeletal:        General: Normal range of motion.     Cervical back: Normal range of motion.  Skin:    General: Skin is warm and dry.     Comments: Left upper thigh induration, flat, blackish and slightly erythematous  Neurological:     General: No focal deficit present.     Mental Status: She is alert and oriented to person, place, and time.  Psychiatric:        Mood and Affect: Mood normal.        Behavior: Behavior normal.        Thought Content: Thought content normal.        Judgment: Judgment normal.     Labs reviewed: Basic Metabolic Panel: No results for input(s): "NA", "K", "CL", "CO2", "GLUCOSE", "BUN", "CREATININE", "CALCIUM", "MG", "PHOS", "TSH" in the last 8760 hours. Liver Function Tests: No results for input(s): "AST", "ALT", "ALKPHOS", "BILITOT", "PROT", "ALBUMIN" in the last 8760 hours. No results for input(s): "LIPASE", "AMYLASE" in the last 8760 hours. No results for input(s): "AMMONIA" in the last 8760 hours. CBC: No results for input(s): "WBC", "NEUTROABS", "HGB", "HCT", "MCV", "PLT" in the last 8760 hours. Lipid Panel: No results for input(s): "CHOL", "HDL",  "LDLCALC", "TRIG", "CHOLHDL", "LDLDIRECT" in the last 8760 hours. No results found for: "HGBA1C"  Procedures since last visit: No results found.  Assessment/Plan  1. Encounter to establish care -  established care with [facility]  2. Urinary incontinence, unspecified type Abnormal urine odor Urinary tract infection without hematuria, site unspecified - POC Urinalysis Dipstick showed  large leukocyte, positive nitrates, positive protein, large blood and positive ketones. - Urine Culture - Complete Metabolic Panel with eGFR; Future - sulfamethoxazole-trimethoprim (BACTRIM DS) 800-160 MG tablet; Take 1 tablet by mouth 2 (two) times daily for 7 days.  Dispense: 14 tablet; Refill: 0  3. Morbid obesity (HCC) Body mass index is 43.87 kg/m. -  encouraged to do daily exercise and healthy meals -  Lipid panel; Future - Hemoglobin A1C; Future  4. Encounter for preventive care - Hemoglobin A1C; Future  5. Encounter for screening mammogram for malignant neoplasm of breast - MM 3D SCREENING MAMMOGRAM BILATERAL BREAST  6. Tachycardia -  HR 112, repeat HR 103 - CBC With Differential/Platelet; Future - Complete Metabolic Panel with eGFR; Future - Lipid panel; Future -  instructed daughter to check HR daily, log and bring to next appointment  7. Screening for STD (sexually transmitted disease) - HIV antibody (with reflex); Future - Hep C Antibody; Future  8. Generalized weakness -  continue Home Health PT and OT for therapeutic strengthening exercises - Ambulatory referral to Home Health for pCA to assist with ADLs  9. Female bladder prolapse -  referred to [facility]     Labs/tests ordered:   CBC, CMP, lipid, A1c, HIV,and hep C antibody  Next appt:  Visit date not found

## 2023-07-15 NOTE — Telephone Encounter (Signed)
Patient's daughter called inquiring about U/A results she would like to know since the U/A indicated blood in urine if patient has a UTI. Please advise.  Message sent to Kenard Gower, NP

## 2023-07-15 NOTE — Patient Instructions (Signed)
Preventive Care 40-62 Years Old, Female Preventive care refers to lifestyle choices and visits with your health care provider that can promote health and wellness. Preventive care visits are also called wellness exams. What can I expect for my preventive care visit? Counseling Your health care provider may ask you questions about your: Medical history, including: Past medical problems. Family medical history. Pregnancy history. Current health, including: Menstrual cycle. Method of birth control. Emotional well-being. Home life and relationship well-being. Sexual activity and sexual health. Lifestyle, including: Alcohol, nicotine or tobacco, and drug use. Access to firearms. Diet, exercise, and sleep habits. Work and work environment. Sunscreen use. Safety issues such as seatbelt and bike helmet use. Physical exam Your health care provider will check your: Height and weight. These may be used to calculate your BMI (body mass index). BMI is a measurement that tells if you are at a healthy weight. Waist circumference. This measures the distance around your waistline. This measurement also tells if you are at a healthy weight and may help predict your risk of certain diseases, such as type 2 diabetes and high blood pressure. Heart rate and blood pressure. Body temperature. Skin for abnormal spots. What immunizations do I need?  Vaccines are usually given at various ages, according to a schedule. Your health care provider will recommend vaccines for you based on your age, medical history, and lifestyle or other factors, such as travel or where you work. What tests do I need? Screening Your health care provider may recommend screening tests for certain conditions. This may include: Lipid and cholesterol levels. Diabetes screening. This is done by checking your blood sugar (glucose) after you have not eaten for a while (fasting). Pelvic exam and Pap test. Hepatitis B test. Hepatitis C  test. HIV (human immunodeficiency virus) test. STI (sexually transmitted infection) testing, if you are at risk. Lung cancer screening. Colorectal cancer screening. Mammogram. Talk with your health care provider about when you should start having regular mammograms. This may depend on whether you have a family history of breast cancer. BRCA-related cancer screening. This may be done if you have a family history of breast, ovarian, tubal, or peritoneal cancers. Bone density scan. This is done to screen for osteoporosis. Talk with your health care provider about your test results, treatment options, and if necessary, the need for more tests. Follow these instructions at home: Eating and drinking  Eat a diet that includes fresh fruits and vegetables, whole grains, lean protein, and low-fat dairy products. Take vitamin and mineral supplements as recommended by your health care provider. Do not drink alcohol if: Your health care provider tells you not to drink. You are pregnant, may be pregnant, or are planning to become pregnant. If you drink alcohol: Limit how much you have to 0-1 drink a day. Know how much alcohol is in your drink. In the [place] one drink equals one 12 oz bottle of beer (355 mL), one 5 oz glass of wine (148 mL), or one 1 oz glass of hard liquor (44 mL). Lifestyle Brush your teeth every morning and night with fluoride toothpaste. Floss one time each day. Exercise for at least 30 minutes 5 or more days each week. Do not use any products that contain nicotine or tobacco. These products include cigarettes, chewing tobacco, and vaping devices, such as e-cigarettes. If you need help quitting, ask your health care provider. Do not use drugs. If you are sexually active, practice safe sex. Use a condom or other form of protection to   prevent STIs. If you do not wish to become pregnant, use a form of birth control. If you plan to become pregnant, see your health care provider for a  prepregnancy visit. Take aspirin only as told by your health care provider. Make sure that you understand how much to take and what form to take. Work with your health care provider to find out whether it is safe and beneficial for you to take aspirin daily. Find healthy ways to manage stress, such as: Meditation, yoga, or listening to music. Journaling. Talking to a trusted person. Spending time with friends and family. Minimize exposure to UV radiation to reduce your risk of skin cancer. Safety Always wear your seat belt while driving or riding in a vehicle. Do not drive: If you have been drinking alcohol. Do not ride with someone who has been drinking. When you are tired or distracted. While texting. If you have been using any mind-altering substances or drugs. Wear a helmet and other protective equipment during sports activities. If you have firearms in your house, make sure you follow all gun safety procedures. Seek help if you have been physically or sexually abused. What's next? Visit your health care provider once a year for an annual wellness visit. Ask your health care provider how often you should have your eyes and teeth checked. Stay up to date on all vaccines. This information is not intended to replace advice given to you by your health care provider. Make sure you discuss any questions you have with your health care provider. Document Revised: 06/10/2021 Document Reviewed: 06/10/2021 Elsevier Patient Education  2024 [employer]  

## 2023-07-18 DIAGNOSIS — R198 Other specified symptoms and signs involving the digestive system and abdomen: Secondary | ICD-10-CM | POA: Diagnosis not present

## 2023-07-18 DIAGNOSIS — N1330 Unspecified hydronephrosis: Secondary | ICD-10-CM | POA: Diagnosis not present

## 2023-07-18 DIAGNOSIS — Z960 Presence of urogenital implants: Secondary | ICD-10-CM | POA: Diagnosis not present

## 2023-07-18 DIAGNOSIS — N281 Cyst of kidney, acquired: Secondary | ICD-10-CM | POA: Diagnosis not present

## 2023-07-18 DIAGNOSIS — N201 Calculus of ureter: Secondary | ICD-10-CM | POA: Diagnosis not present

## 2023-07-18 LAB — URINE CULTURE
MICRO NUMBER:: 15225414
SPECIMEN QUALITY:: ADEQUATE

## 2023-07-19 ENCOUNTER — Encounter: Payer: 59 | Admitting: Adult Health

## 2023-07-19 DIAGNOSIS — Z960 Presence of urogenital implants: Secondary | ICD-10-CM | POA: Diagnosis not present

## 2023-07-19 DIAGNOSIS — R000 Tachycardia, unspecified: Secondary | ICD-10-CM

## 2023-07-19 DIAGNOSIS — Z0000 Encounter for general adult medical examination without abnormal findings: Secondary | ICD-10-CM

## 2023-07-19 DIAGNOSIS — J90 Pleural effusion, not elsewhere classified: Secondary | ICD-10-CM | POA: Diagnosis not present

## 2023-07-19 DIAGNOSIS — I2489 Other forms of acute ischemic heart disease: Secondary | ICD-10-CM | POA: Diagnosis not present

## 2023-07-19 DIAGNOSIS — N390 Urinary tract infection, site not specified: Secondary | ICD-10-CM

## 2023-07-19 DIAGNOSIS — K5289 Other specified noninfective gastroenteritis and colitis: Secondary | ICD-10-CM | POA: Diagnosis not present

## 2023-07-19 DIAGNOSIS — Z9181 History of falling: Secondary | ICD-10-CM | POA: Diagnosis not present

## 2023-07-19 DIAGNOSIS — R1310 Dysphagia, unspecified: Secondary | ICD-10-CM | POA: Diagnosis not present

## 2023-07-19 DIAGNOSIS — Z113 Encounter for screening for infections with a predominantly sexual mode of transmission: Secondary | ICD-10-CM

## 2023-07-19 DIAGNOSIS — M1990 Unspecified osteoarthritis, unspecified site: Secondary | ICD-10-CM | POA: Diagnosis not present

## 2023-07-19 DIAGNOSIS — G9341 Metabolic encephalopathy: Secondary | ICD-10-CM | POA: Diagnosis not present

## 2023-07-19 DIAGNOSIS — E1165 Type 2 diabetes mellitus with hyperglycemia: Secondary | ICD-10-CM | POA: Diagnosis not present

## 2023-07-19 DIAGNOSIS — R7303 Prediabetes: Secondary | ICD-10-CM | POA: Diagnosis not present

## 2023-07-19 LAB — CBC WITH DIFFERENTIAL/PLATELET
Absolute Monocytes: 804 cells/uL (ref 200–950)
Basophils Absolute: 59 cells/uL (ref 0–200)
Basophils Relative: 0.6 %
Hemoglobin: 12.4 g/dL (ref 11.7–15.5)
Lymphs Abs: 1509 cells/uL (ref 850–3900)
MCH: 27 pg (ref 27.0–33.0)
MPV: 8.9 fL (ref 7.5–12.5)
Monocytes Relative: 8.2 %
Neutro Abs: 7193 cells/uL (ref 1500–7800)
Neutrophils Relative %: 73.4 %
Platelets: 401 10*3/uL — ABNORMAL HIGH (ref 140–400)
RBC: 4.6 10*6/uL (ref 3.80–5.10)
WBC: 9.8 10*3/uL (ref 3.8–10.8)

## 2023-07-19 NOTE — Progress Notes (Signed)
Is she following up with urology at [facility]? I see some documentation at [facility]. Let me know if I need to refer her.

## 2023-07-19 NOTE — Progress Notes (Deleted)
CBC all within normal

## 2023-07-19 NOTE — Progress Notes (Signed)
-    urine culture confirms UTI and bacteria isolate is sensitive to Bactrim DS, continue Bactrim DS ordered on 07/15/23.

## 2023-07-20 DIAGNOSIS — K5289 Other specified noninfective gastroenteritis and colitis: Secondary | ICD-10-CM | POA: Diagnosis not present

## 2023-07-20 DIAGNOSIS — M1990 Unspecified osteoarthritis, unspecified site: Secondary | ICD-10-CM | POA: Diagnosis not present

## 2023-07-20 DIAGNOSIS — J90 Pleural effusion, not elsewhere classified: Secondary | ICD-10-CM | POA: Diagnosis not present

## 2023-07-20 DIAGNOSIS — I2489 Other forms of acute ischemic heart disease: Secondary | ICD-10-CM | POA: Diagnosis not present

## 2023-07-20 DIAGNOSIS — E1165 Type 2 diabetes mellitus with hyperglycemia: Secondary | ICD-10-CM | POA: Diagnosis not present

## 2023-07-20 DIAGNOSIS — R1310 Dysphagia, unspecified: Secondary | ICD-10-CM | POA: Diagnosis not present

## 2023-07-20 DIAGNOSIS — G9341 Metabolic encephalopathy: Secondary | ICD-10-CM | POA: Diagnosis not present

## 2023-07-20 DIAGNOSIS — N390 Urinary tract infection, site not specified: Secondary | ICD-10-CM | POA: Diagnosis not present

## 2023-07-20 DIAGNOSIS — Z9181 History of falling: Secondary | ICD-10-CM | POA: Diagnosis not present

## 2023-07-20 DIAGNOSIS — Z960 Presence of urogenital implants: Secondary | ICD-10-CM | POA: Diagnosis not present

## 2023-07-20 LAB — COMPLETE METABOLIC PANEL WITH GFR
AG Ratio: 1.1 (calc) (ref 1.0–2.5)
ALT: 53 U/L — ABNORMAL HIGH (ref 6–29)
AST: 16 U/L (ref 10–35)
Albumin: 3.5 g/dL — ABNORMAL LOW (ref 3.6–5.1)
Alkaline phosphatase (APISO): 207 U/L — ABNORMAL HIGH (ref 37–153)
BUN/Creatinine Ratio: 36 (calc) — ABNORMAL HIGH (ref 6–22)
BUN: 15 mg/dL (ref 7–25)
CO2: 27 mmol/L (ref 20–32)
Calcium: 8.8 mg/dL (ref 8.6–10.4)
Chloride: 101 mmol/L (ref 98–110)
Creat: 0.42 mg/dL — ABNORMAL LOW (ref 0.50–1.05)
Globulin: 3.2 g/dL (calc) (ref 1.9–3.7)
Glucose, Bld: 111 mg/dL — ABNORMAL HIGH (ref 65–99)
Potassium: 3.8 mmol/L (ref 3.5–5.3)
Sodium: 140 mmol/L (ref 135–146)
Total Bilirubin: 0.4 mg/dL (ref 0.2–1.2)
Total Protein: 6.7 g/dL (ref 6.1–8.1)
eGFR: 111 mL/min/{1.73_m2} (ref >=60)

## 2023-07-20 LAB — CBC WITH DIFFERENTIAL/PLATELET
Eosinophils Absolute: 235 cells/uL (ref 15–500)
Eosinophils Relative: 2.4 %
HCT: 38.2 % (ref 35.0–45.0)
MCHC: 32.5 g/dL (ref 32.0–36.0)
MCV: 83 fL (ref 80.0–100.0)
RDW: 14.3 % (ref 11.0–15.0)
Total Lymphocyte: 15.4 %

## 2023-07-20 LAB — LIPID PANEL
Cholesterol: 164 mg/dL (ref <200)
HDL: 43 mg/dL — ABNORMAL LOW (ref >=50)
LDL Cholesterol (Calc): 97 mg/dL (calc)
Non-HDL Cholesterol (Calc): 121 mg/dL (calc) (ref <130)
Total CHOL/HDL Ratio: 3.8 (calc) (ref <5.0)
Triglycerides: 147 mg/dL (ref <150)

## 2023-07-20 LAB — HEMOGLOBIN A1C
Hgb A1c MFr Bld: 5.6 % of total Hgb (ref <5.7)
Mean Plasma Glucose: 114 mg/dL
eAG (mmol/L): 6.3 mmol/L

## 2023-07-20 LAB — HIV ANTIBODY (ROUTINE TESTING W REFLEX): HIV 1&2 Ab, 4th Generation: NONREACTIVE

## 2023-07-20 LAB — HEPATITIS C ANTIBODY: Hepatitis C Ab: NONREACTIVE

## 2023-07-20 NOTE — Telephone Encounter (Signed)
LATE ENTRY: I did speak with patient's daughter on 07/15/23 an informed her that medication had been sent to pharmacy.

## 2023-07-21 DIAGNOSIS — K5289 Other specified noninfective gastroenteritis and colitis: Secondary | ICD-10-CM | POA: Diagnosis not present

## 2023-07-21 DIAGNOSIS — N390 Urinary tract infection, site not specified: Secondary | ICD-10-CM | POA: Diagnosis not present

## 2023-07-21 DIAGNOSIS — R1310 Dysphagia, unspecified: Secondary | ICD-10-CM | POA: Diagnosis not present

## 2023-07-21 DIAGNOSIS — Z960 Presence of urogenital implants: Secondary | ICD-10-CM | POA: Diagnosis not present

## 2023-07-21 DIAGNOSIS — E1165 Type 2 diabetes mellitus with hyperglycemia: Secondary | ICD-10-CM | POA: Diagnosis not present

## 2023-07-21 DIAGNOSIS — J90 Pleural effusion, not elsewhere classified: Secondary | ICD-10-CM | POA: Diagnosis not present

## 2023-07-21 DIAGNOSIS — G9341 Metabolic encephalopathy: Secondary | ICD-10-CM | POA: Diagnosis not present

## 2023-07-21 DIAGNOSIS — M1990 Unspecified osteoarthritis, unspecified site: Secondary | ICD-10-CM | POA: Diagnosis not present

## 2023-07-21 DIAGNOSIS — I2489 Other forms of acute ischemic heart disease: Secondary | ICD-10-CM | POA: Diagnosis not present

## 2023-07-21 DIAGNOSIS — Z9181 History of falling: Secondary | ICD-10-CM | POA: Diagnosis not present

## 2023-07-22 ENCOUNTER — Ambulatory Visit (INDEPENDENT_AMBULATORY_CARE_PROVIDER_SITE_OTHER): Payer: 59 | Admitting: Adult Health

## 2023-07-22 ENCOUNTER — Encounter: Payer: Self-pay | Admitting: Adult Health

## 2023-07-22 VITALS — BP 127/88 | HR 107 | Temp 97.5°F | Resp 18 | Ht 65.0 in

## 2023-07-22 DIAGNOSIS — R531 Weakness: Secondary | ICD-10-CM

## 2023-07-22 DIAGNOSIS — R000 Tachycardia, unspecified: Secondary | ICD-10-CM | POA: Diagnosis not present

## 2023-07-22 DIAGNOSIS — N8110 Cystocele, unspecified: Secondary | ICD-10-CM | POA: Diagnosis not present

## 2023-07-22 DIAGNOSIS — N390 Urinary tract infection, site not specified: Secondary | ICD-10-CM | POA: Diagnosis not present

## 2023-07-22 DIAGNOSIS — N201 Calculus of ureter: Secondary | ICD-10-CM

## 2023-07-22 MED ORDER — METOPROLOL TARTRATE 25 MG PO TABS
25.0000 mg | ORAL_TABLET | Freq: Every day | ORAL | 3 refills | Status: DC
Start: 2023-07-22 — End: 2023-08-22

## 2023-07-22 NOTE — Progress Notes (Unsigned)
[facility] clinic  Provider:   Code Status: *** Goals of Care:     07/15/2023    9:19 AM  Advanced Directives  Does Patient Have a Medical Advance Directive? No  Would patient like information on creating a medical advance directive? No - Patient declined     Chief Complaint  Patient presents with   Follow-up    1 week results of blood work    HPI: Patient is a 62 y.o. female seen today for an acute visit for   Wt Readings from Last 3 Encounters:  07/15/23 263 lb 9.6 oz (119.6 kg)  02/17/22 250 lb (113.4 kg)  01/26/22 265 lb (120.2 kg)     Past Medical History:  Diagnosis Date   Anemia    w/pregnancy, no current problems   Arthritis    severe   History of kidney stones    passed stones    Past Surgical History:  Procedure Laterality Date   ABDOMINAL HYSTERECTOMY  06/07/2014   COLONOSCOPY      Allergies  Allergen Reactions   No Known Allergies     Outpatient Encounter Medications as of 07/22/2023  Medication Sig   acetaminophen (TYLENOL) 650 MG CR tablet Take 650 mg by mouth every 8 (eight) hours as needed for pain.   ibuprofen (ADVIL) 200 MG tablet Take 200 mg by mouth every 6 (six) hours as needed for mild pain or moderate pain. Motrin   Menthol-Camphor (TIGER BALM ARTHRITIS RUB) 11-11 % CREA Apply 1 application topically daily as needed (pain).   naproxen sodium (ALEVE) 220 MG tablet Take 220 mg by mouth daily as needed (Pain).   sulfamethoxazole-trimethoprim (BACTRIM DS) 800-160 MG tablet Take 1 tablet by mouth 2 (two) times daily for 7 days.   No facility-administered encounter medications on file as of 07/22/2023.    Review of Systems:  Review of Systems  Constitutional:  Negative for appetite change, chills, fatigue and fever.  HENT:  Negative for congestion, hearing loss, rhinorrhea and sore throat.   Eyes: Negative.   Respiratory:  Negative for cough, shortness of breath and wheezing.   Cardiovascular:  Negative for chest pain, palpitations and  leg swelling.  Gastrointestinal:  Negative for abdominal pain, constipation, diarrhea, nausea and vomiting.  Genitourinary:  Negative for dysuria.  Musculoskeletal:  Negative for arthralgias, back pain and myalgias.  Skin:  Negative for color change, rash and wound.  Neurological:  Negative for dizziness, weakness and headaches.  Psychiatric/Behavioral:  Negative for behavioral problems. The patient is not nervous/anxious.     Health Maintenance  Topic Date Due   Medicare Annual Wellness (AWV)  Never done   DTaP/Tdap/Td (1 - Tdap) Never done   MAMMOGRAM  Never done   Zoster Vaccines- Shingrix (1 of 2) Never done   PAP SMEAR-Modifier  05/13/2017   COVID-19 Vaccine (1 - 2023-24 season) Never done   INFLUENZA VACCINE  07/28/2023   Colonoscopy  09/04/2024   Hepatitis C Screening  Completed   HIV Screening  Completed   HPV VACCINES  Aged Out    Physical Exam: Vitals:   07/22/23 0941  Height: 5\' 5"  (1.651 m)   Body mass index is 43.87 kg/m. Physical Exam Constitutional:      General: She is not in acute distress.    Appearance: She is obese.  HENT:     Head: Normocephalic and atraumatic.     Nose: Nose normal.     Mouth/Throat:     Mouth: Mucous membranes are  moist.  Eyes:     Conjunctiva/sclera: Conjunctivae normal.  Cardiovascular:     Rate and Rhythm: Normal rate and regular rhythm.  Pulmonary:     Effort: Pulmonary effort is normal.     Breath sounds: Normal breath sounds.  Abdominal:     General: Bowel sounds are normal.     Palpations: Abdomen is soft.  Musculoskeletal:        General: Normal range of motion.     Cervical back: Normal range of motion.  Skin:    General: Skin is warm and dry.  Neurological:     General: No focal deficit present.     Mental Status: She is alert and oriented to person, place, and time.  Psychiatric:        Mood and Affect: Mood normal.        Behavior: Behavior normal.        Thought Content: Thought content normal.         Judgment: Judgment normal.     Labs reviewed: Basic Metabolic Panel: Recent Labs    07/19/23 0936  NA 140  K 3.8  CL 101  CO2 27  GLUCOSE 111*  BUN 15  CREATININE 0.42*  CALCIUM 8.8   Liver Function Tests: Recent Labs    07/19/23 0936  AST 16  ALT 53*  BILITOT 0.4  PROT 6.7   No results for input(s): "LIPASE", "AMYLASE" in the last 8760 hours. No results for input(s): "AMMONIA" in the last 8760 hours. CBC: Recent Labs    07/19/23 0936  WBC 9.8  NEUTROABS 7,193  HGB 12.4  HCT 38.2  MCV 83.0  PLT 401*   Lipid Panel: Recent Labs    07/19/23 0936  CHOL 164  HDL 43*  LDLCALC 97  TRIG 191  CHOLHDL 3.8   Lab Results  Component Value Date   HGBA1C 5.6 07/19/2023    Procedures since last visit: No results found.  Assessment/Plan     Labs/tests ordered:    Next appt:  Visit date not found

## 2023-07-25 ENCOUNTER — Ambulatory Visit: Payer: 59 | Admitting: Orthopaedic Surgery

## 2023-07-25 DIAGNOSIS — G9341 Metabolic encephalopathy: Secondary | ICD-10-CM | POA: Diagnosis not present

## 2023-07-25 DIAGNOSIS — I2489 Other forms of acute ischemic heart disease: Secondary | ICD-10-CM | POA: Diagnosis not present

## 2023-07-25 DIAGNOSIS — M1990 Unspecified osteoarthritis, unspecified site: Secondary | ICD-10-CM | POA: Diagnosis not present

## 2023-07-25 DIAGNOSIS — N390 Urinary tract infection, site not specified: Secondary | ICD-10-CM | POA: Diagnosis not present

## 2023-07-25 DIAGNOSIS — Z960 Presence of urogenital implants: Secondary | ICD-10-CM | POA: Diagnosis not present

## 2023-07-25 DIAGNOSIS — Z9181 History of falling: Secondary | ICD-10-CM | POA: Diagnosis not present

## 2023-07-25 DIAGNOSIS — K5289 Other specified noninfective gastroenteritis and colitis: Secondary | ICD-10-CM | POA: Diagnosis not present

## 2023-07-25 DIAGNOSIS — R1310 Dysphagia, unspecified: Secondary | ICD-10-CM | POA: Diagnosis not present

## 2023-07-25 DIAGNOSIS — J90 Pleural effusion, not elsewhere classified: Secondary | ICD-10-CM | POA: Diagnosis not present

## 2023-07-25 DIAGNOSIS — E1165 Type 2 diabetes mellitus with hyperglycemia: Secondary | ICD-10-CM | POA: Diagnosis not present

## 2023-07-26 DIAGNOSIS — Z9181 History of falling: Secondary | ICD-10-CM | POA: Diagnosis not present

## 2023-07-26 DIAGNOSIS — R1310 Dysphagia, unspecified: Secondary | ICD-10-CM | POA: Diagnosis not present

## 2023-07-26 DIAGNOSIS — J90 Pleural effusion, not elsewhere classified: Secondary | ICD-10-CM | POA: Diagnosis not present

## 2023-07-26 DIAGNOSIS — K5289 Other specified noninfective gastroenteritis and colitis: Secondary | ICD-10-CM | POA: Diagnosis not present

## 2023-07-26 DIAGNOSIS — M1990 Unspecified osteoarthritis, unspecified site: Secondary | ICD-10-CM | POA: Diagnosis not present

## 2023-07-26 DIAGNOSIS — I2489 Other forms of acute ischemic heart disease: Secondary | ICD-10-CM | POA: Diagnosis not present

## 2023-07-26 DIAGNOSIS — G9341 Metabolic encephalopathy: Secondary | ICD-10-CM | POA: Diagnosis not present

## 2023-07-26 DIAGNOSIS — Z960 Presence of urogenital implants: Secondary | ICD-10-CM | POA: Diagnosis not present

## 2023-07-26 DIAGNOSIS — E1165 Type 2 diabetes mellitus with hyperglycemia: Secondary | ICD-10-CM | POA: Diagnosis not present

## 2023-07-26 DIAGNOSIS — N390 Urinary tract infection, site not specified: Secondary | ICD-10-CM | POA: Diagnosis not present

## 2023-07-27 ENCOUNTER — Other Ambulatory Visit (INDEPENDENT_AMBULATORY_CARE_PROVIDER_SITE_OTHER): Payer: 59

## 2023-07-27 ENCOUNTER — Ambulatory Visit (INDEPENDENT_AMBULATORY_CARE_PROVIDER_SITE_OTHER): Payer: 59 | Admitting: Orthopaedic Surgery

## 2023-07-27 ENCOUNTER — Telehealth: Payer: Self-pay

## 2023-07-27 DIAGNOSIS — M1712 Unilateral primary osteoarthritis, left knee: Secondary | ICD-10-CM

## 2023-07-27 DIAGNOSIS — M1711 Unilateral primary osteoarthritis, right knee: Secondary | ICD-10-CM

## 2023-07-27 DIAGNOSIS — G8929 Other chronic pain: Secondary | ICD-10-CM

## 2023-07-27 DIAGNOSIS — M25562 Pain in left knee: Secondary | ICD-10-CM | POA: Diagnosis not present

## 2023-07-27 DIAGNOSIS — M25561 Pain in right knee: Secondary | ICD-10-CM | POA: Diagnosis not present

## 2023-07-27 MED ORDER — LIDOCAINE HCL 1 % IJ SOLN
3.0000 mL | INTRAMUSCULAR | Status: AC | PRN
Start: 2023-07-27 — End: 2023-07-27
  Administered 2023-07-27: 3 mL

## 2023-07-27 MED ORDER — METHYLPREDNISOLONE ACETATE 40 MG/ML IJ SUSP
40.0000 mg | INTRAMUSCULAR | Status: AC | PRN
Start: 2023-07-27 — End: 2023-07-27
  Administered 2023-07-27: 40 mg via INTRA_ARTICULAR

## 2023-07-27 NOTE — Telephone Encounter (Signed)
Paperwork was dropped off this morning and placed in Medina-Vargas, Monina C, NP review and sign folder for completion. Once paperwork is completed it should be given to the administrative staff for further processing.  Fax to [phone]

## 2023-07-27 NOTE — Progress Notes (Signed)
The patient comes in today for continued follow-up as a relates to debilitating pain with both of her knees and severe arthritis in both her knees.  Apparently she is having a surgery or procedure for kidney stones next week.  She is requesting steroid injections in both knees today.  She has had these from time to time and it has been 5 months since her last injections with steroid.  She was actually been hospitalized a month ago with severe urosepsis and was on a ventilator for a week.  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  Both knees hurt on a regular basis as well.  X-rays of both knees today show severe bone-on-bone wear of all 3 compartments mainly involving the patellofemoral joint and the lateral compartment of both knees with no joint space remaining.  Both knees have pain throughout the arc of motion.  The left knee has more effusion than the right knee.  I did place a steroid injection in both knees today.  There is really not any good surgical options for her.  Eventually certainly knee replacements would be more attainable.  We can certainly see her back in the office for repeat injections in 3 to 4 months.  The next time she does, and we need a weight and BMI calculation.     Procedure Note  Patient: Mariah Morrison             Date of Birth: 01-Sep-1961           MRN: [id number]             Visit Date: 07/27/2023  Procedures: Visit Diagnoses:  1. Unilateral primary osteoarthritis, left knee   2. Unilateral primary osteoarthritis, right knee     Large Joint Inj: R knee on 07/27/2023 8:41 AM Indications: diagnostic evaluation and pain Details: 22 G 1.5 in needle, superolateral approach  Arthrogram: No  Medications: 3 mL lidocaine 1 %; 40 mg methylPREDNISolone acetate 40 MG/ML Outcome: tolerated well, no immediate complications Procedure, treatment alternatives, risks and benefits explained, specific risks discussed.  Consent was given by the patient. Immediately prior to procedure a time out was called to verify the correct patient, procedure, equipment, support staff and site/side marked as required. Patient was prepped and draped in the usual sterile fashion.    Large Joint Inj: L knee on 07/27/2023 8:41 AM Indications: diagnostic evaluation and pain Details: 22 G 1.5 in needle, superolateral approach  Arthrogram: No  Medications: 3 mL lidocaine 1 %; 40 mg methylPREDNISolone acetate 40 MG/ML Outcome: tolerated well, no immediate complications Procedure, treatment alternatives, risks and benefits explained, specific risks discussed. Consent was given by the patient. Immediately prior to procedure a time out was called to verify the correct patient, procedure, equipment, support staff and site/side marked as required. Patient was prepped and draped in the usual sterile fashion.

## 2023-07-28 ENCOUNTER — Telehealth: Payer: Self-pay | Admitting: Adult Health

## 2023-07-28 NOTE — Telephone Encounter (Signed)
Monina completed CAP consent form for the patient. Per daughter, Tamera Punt, I faxed the CAP form to the facility, mailed Miranda the hard copy, and then sent a copy to the scan center.

## 2023-07-29 DIAGNOSIS — E1165 Type 2 diabetes mellitus with hyperglycemia: Secondary | ICD-10-CM | POA: Diagnosis not present

## 2023-07-29 DIAGNOSIS — Z9181 History of falling: Secondary | ICD-10-CM | POA: Diagnosis not present

## 2023-07-29 DIAGNOSIS — R1310 Dysphagia, unspecified: Secondary | ICD-10-CM | POA: Diagnosis not present

## 2023-07-29 DIAGNOSIS — Z960 Presence of urogenital implants: Secondary | ICD-10-CM | POA: Diagnosis not present

## 2023-07-29 DIAGNOSIS — J90 Pleural effusion, not elsewhere classified: Secondary | ICD-10-CM | POA: Diagnosis not present

## 2023-07-29 DIAGNOSIS — K5289 Other specified noninfective gastroenteritis and colitis: Secondary | ICD-10-CM | POA: Diagnosis not present

## 2023-07-29 DIAGNOSIS — I2489 Other forms of acute ischemic heart disease: Secondary | ICD-10-CM | POA: Diagnosis not present

## 2023-07-29 DIAGNOSIS — N390 Urinary tract infection, site not specified: Secondary | ICD-10-CM | POA: Diagnosis not present

## 2023-07-29 DIAGNOSIS — G9341 Metabolic encephalopathy: Secondary | ICD-10-CM | POA: Diagnosis not present

## 2023-07-29 DIAGNOSIS — M1990 Unspecified osteoarthritis, unspecified site: Secondary | ICD-10-CM | POA: Diagnosis not present

## 2023-07-30 DIAGNOSIS — I2489 Other forms of acute ischemic heart disease: Secondary | ICD-10-CM | POA: Diagnosis not present

## 2023-07-30 DIAGNOSIS — M1990 Unspecified osteoarthritis, unspecified site: Secondary | ICD-10-CM | POA: Diagnosis not present

## 2023-07-30 DIAGNOSIS — E1165 Type 2 diabetes mellitus with hyperglycemia: Secondary | ICD-10-CM | POA: Diagnosis not present

## 2023-07-30 DIAGNOSIS — G9341 Metabolic encephalopathy: Secondary | ICD-10-CM | POA: Diagnosis not present

## 2023-07-30 DIAGNOSIS — K5289 Other specified noninfective gastroenteritis and colitis: Secondary | ICD-10-CM | POA: Diagnosis not present

## 2023-07-30 DIAGNOSIS — Z960 Presence of urogenital implants: Secondary | ICD-10-CM | POA: Diagnosis not present

## 2023-07-30 DIAGNOSIS — J90 Pleural effusion, not elsewhere classified: Secondary | ICD-10-CM | POA: Diagnosis not present

## 2023-07-30 DIAGNOSIS — R1310 Dysphagia, unspecified: Secondary | ICD-10-CM | POA: Diagnosis not present

## 2023-07-30 DIAGNOSIS — Z9181 History of falling: Secondary | ICD-10-CM | POA: Diagnosis not present

## 2023-07-30 DIAGNOSIS — N390 Urinary tract infection, site not specified: Secondary | ICD-10-CM | POA: Diagnosis not present

## 2023-08-01 DIAGNOSIS — M1990 Unspecified osteoarthritis, unspecified site: Secondary | ICD-10-CM | POA: Diagnosis not present

## 2023-08-01 DIAGNOSIS — R1310 Dysphagia, unspecified: Secondary | ICD-10-CM | POA: Diagnosis not present

## 2023-08-01 DIAGNOSIS — I2489 Other forms of acute ischemic heart disease: Secondary | ICD-10-CM | POA: Diagnosis not present

## 2023-08-01 DIAGNOSIS — E1165 Type 2 diabetes mellitus with hyperglycemia: Secondary | ICD-10-CM | POA: Diagnosis not present

## 2023-08-01 DIAGNOSIS — Z9181 History of falling: Secondary | ICD-10-CM | POA: Diagnosis not present

## 2023-08-01 DIAGNOSIS — G9341 Metabolic encephalopathy: Secondary | ICD-10-CM | POA: Diagnosis not present

## 2023-08-01 DIAGNOSIS — Z960 Presence of urogenital implants: Secondary | ICD-10-CM | POA: Diagnosis not present

## 2023-08-01 DIAGNOSIS — N390 Urinary tract infection, site not specified: Secondary | ICD-10-CM | POA: Diagnosis not present

## 2023-08-01 DIAGNOSIS — J90 Pleural effusion, not elsewhere classified: Secondary | ICD-10-CM | POA: Diagnosis not present

## 2023-08-01 DIAGNOSIS — K5289 Other specified noninfective gastroenteritis and colitis: Secondary | ICD-10-CM | POA: Diagnosis not present

## 2023-08-02 ENCOUNTER — Telehealth: Payer: Self-pay

## 2023-08-02 DIAGNOSIS — N132 Hydronephrosis with renal and ureteral calculous obstruction: Secondary | ICD-10-CM | POA: Diagnosis not present

## 2023-08-02 DIAGNOSIS — Z79899 Other long term (current) drug therapy: Secondary | ICD-10-CM | POA: Diagnosis not present

## 2023-08-02 NOTE — Telephone Encounter (Signed)
Home Health called to inform Medina-Vargas, Monina C, NP that patient had a missed a occupational therapy visit

## 2023-08-02 NOTE — Telephone Encounter (Signed)
Noted  

## 2023-08-08 DIAGNOSIS — N200 Calculus of kidney: Secondary | ICD-10-CM | POA: Diagnosis not present

## 2023-08-09 ENCOUNTER — Ambulatory Visit: Payer: 59 | Admitting: Orthopaedic Surgery

## 2023-08-09 ENCOUNTER — Ambulatory Visit: Payer: 59

## 2023-08-09 DIAGNOSIS — Z960 Presence of urogenital implants: Secondary | ICD-10-CM | POA: Diagnosis not present

## 2023-08-09 DIAGNOSIS — R1310 Dysphagia, unspecified: Secondary | ICD-10-CM | POA: Diagnosis not present

## 2023-08-09 DIAGNOSIS — M1990 Unspecified osteoarthritis, unspecified site: Secondary | ICD-10-CM | POA: Diagnosis not present

## 2023-08-09 DIAGNOSIS — N390 Urinary tract infection, site not specified: Secondary | ICD-10-CM | POA: Diagnosis not present

## 2023-08-09 DIAGNOSIS — G9341 Metabolic encephalopathy: Secondary | ICD-10-CM | POA: Diagnosis not present

## 2023-08-09 DIAGNOSIS — K5289 Other specified noninfective gastroenteritis and colitis: Secondary | ICD-10-CM | POA: Diagnosis not present

## 2023-08-09 DIAGNOSIS — J90 Pleural effusion, not elsewhere classified: Secondary | ICD-10-CM | POA: Diagnosis not present

## 2023-08-09 DIAGNOSIS — I2489 Other forms of acute ischemic heart disease: Secondary | ICD-10-CM | POA: Diagnosis not present

## 2023-08-09 DIAGNOSIS — Z9181 History of falling: Secondary | ICD-10-CM | POA: Diagnosis not present

## 2023-08-09 DIAGNOSIS — E1165 Type 2 diabetes mellitus with hyperglycemia: Secondary | ICD-10-CM | POA: Diagnosis not present

## 2023-08-10 DIAGNOSIS — R1310 Dysphagia, unspecified: Secondary | ICD-10-CM | POA: Diagnosis not present

## 2023-08-10 DIAGNOSIS — Z960 Presence of urogenital implants: Secondary | ICD-10-CM | POA: Diagnosis not present

## 2023-08-10 DIAGNOSIS — K5289 Other specified noninfective gastroenteritis and colitis: Secondary | ICD-10-CM | POA: Diagnosis not present

## 2023-08-10 DIAGNOSIS — J90 Pleural effusion, not elsewhere classified: Secondary | ICD-10-CM | POA: Diagnosis not present

## 2023-08-10 DIAGNOSIS — N390 Urinary tract infection, site not specified: Secondary | ICD-10-CM | POA: Diagnosis not present

## 2023-08-10 DIAGNOSIS — G9341 Metabolic encephalopathy: Secondary | ICD-10-CM | POA: Diagnosis not present

## 2023-08-10 DIAGNOSIS — M1990 Unspecified osteoarthritis, unspecified site: Secondary | ICD-10-CM | POA: Diagnosis not present

## 2023-08-10 DIAGNOSIS — I2489 Other forms of acute ischemic heart disease: Secondary | ICD-10-CM | POA: Diagnosis not present

## 2023-08-10 DIAGNOSIS — Z9181 History of falling: Secondary | ICD-10-CM | POA: Diagnosis not present

## 2023-08-10 DIAGNOSIS — E1165 Type 2 diabetes mellitus with hyperglycemia: Secondary | ICD-10-CM | POA: Diagnosis not present

## 2023-08-11 DIAGNOSIS — K5289 Other specified noninfective gastroenteritis and colitis: Secondary | ICD-10-CM | POA: Diagnosis not present

## 2023-08-11 DIAGNOSIS — Z960 Presence of urogenital implants: Secondary | ICD-10-CM | POA: Diagnosis not present

## 2023-08-11 DIAGNOSIS — M1990 Unspecified osteoarthritis, unspecified site: Secondary | ICD-10-CM | POA: Diagnosis not present

## 2023-08-11 DIAGNOSIS — I2489 Other forms of acute ischemic heart disease: Secondary | ICD-10-CM | POA: Diagnosis not present

## 2023-08-11 DIAGNOSIS — G9341 Metabolic encephalopathy: Secondary | ICD-10-CM | POA: Diagnosis not present

## 2023-08-11 DIAGNOSIS — Z9181 History of falling: Secondary | ICD-10-CM | POA: Diagnosis not present

## 2023-08-11 DIAGNOSIS — J90 Pleural effusion, not elsewhere classified: Secondary | ICD-10-CM | POA: Diagnosis not present

## 2023-08-11 DIAGNOSIS — R1310 Dysphagia, unspecified: Secondary | ICD-10-CM | POA: Diagnosis not present

## 2023-08-11 DIAGNOSIS — N390 Urinary tract infection, site not specified: Secondary | ICD-10-CM | POA: Diagnosis not present

## 2023-08-11 DIAGNOSIS — E1165 Type 2 diabetes mellitus with hyperglycemia: Secondary | ICD-10-CM | POA: Diagnosis not present

## 2023-08-15 DIAGNOSIS — G9341 Metabolic encephalopathy: Secondary | ICD-10-CM | POA: Diagnosis not present

## 2023-08-15 DIAGNOSIS — M1990 Unspecified osteoarthritis, unspecified site: Secondary | ICD-10-CM | POA: Diagnosis not present

## 2023-08-15 DIAGNOSIS — E1165 Type 2 diabetes mellitus with hyperglycemia: Secondary | ICD-10-CM | POA: Diagnosis not present

## 2023-08-15 DIAGNOSIS — I2489 Other forms of acute ischemic heart disease: Secondary | ICD-10-CM | POA: Diagnosis not present

## 2023-08-15 DIAGNOSIS — Z9181 History of falling: Secondary | ICD-10-CM | POA: Diagnosis not present

## 2023-08-15 DIAGNOSIS — J90 Pleural effusion, not elsewhere classified: Secondary | ICD-10-CM | POA: Diagnosis not present

## 2023-08-15 DIAGNOSIS — K5289 Other specified noninfective gastroenteritis and colitis: Secondary | ICD-10-CM | POA: Diagnosis not present

## 2023-08-15 DIAGNOSIS — R1310 Dysphagia, unspecified: Secondary | ICD-10-CM | POA: Diagnosis not present

## 2023-08-15 DIAGNOSIS — Z960 Presence of urogenital implants: Secondary | ICD-10-CM | POA: Diagnosis not present

## 2023-08-15 DIAGNOSIS — N390 Urinary tract infection, site not specified: Secondary | ICD-10-CM | POA: Diagnosis not present

## 2023-08-22 ENCOUNTER — Ambulatory Visit (INDEPENDENT_AMBULATORY_CARE_PROVIDER_SITE_OTHER): Payer: 59 | Admitting: Adult Health

## 2023-08-22 ENCOUNTER — Encounter: Payer: Self-pay | Admitting: Adult Health

## 2023-08-22 VITALS — BP 120/86 | HR 103 | Temp 97.3°F | Resp 18 | Ht 65.0 in | Wt 259.0 lb

## 2023-08-22 DIAGNOSIS — R531 Weakness: Secondary | ICD-10-CM

## 2023-08-22 DIAGNOSIS — N8110 Cystocele, unspecified: Secondary | ICD-10-CM

## 2023-08-22 DIAGNOSIS — R32 Unspecified urinary incontinence: Secondary | ICD-10-CM

## 2023-08-22 DIAGNOSIS — R000 Tachycardia, unspecified: Secondary | ICD-10-CM

## 2023-08-22 MED ORDER — METOPROLOL TARTRATE 25 MG PO TABS
12.5000 mg | ORAL_TABLET | Freq: Two times a day (BID) | ORAL | Status: DC
Start: 2023-08-22 — End: 2023-10-27

## 2023-08-22 NOTE — Progress Notes (Signed)
This encounter was created in error - please disregard.

## 2023-08-22 NOTE — Progress Notes (Unsigned)
[facility] clinic  Provider: Kenard Gower DNP  Code Status:  Full Code  Goals of Care:     07/22/2023   10:13 AM  Advanced Directives  Does Patient Have a Medical Advance Directive? No  Would patient like information on creating a medical advance directive? No - Patient declined     Chief Complaint  Patient presents with   Medication Management    1 month follow up.   Health Maintenance    Discuss the need for AWV, PAP Smear, Mammogram     Immunizations    Discuss the need for Shingrix, Covid Vaccine , Flu Vaccine      HPI: Patient is a 62 y.o. female seen today for medical management of chronic issues. She was accompanied today by her daughter. She declined mammogram, pap smear,Influenza vaccine and COVID vaccine. Daughter stated that patient had 3 COVID-19 vaccines already. 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   Tachycardia - HR 103, takes metoprolol tartrate (LOPRESSOR) 25 MG tablet daily, did not take medication X 2 days, ran out of medication  Morbid obesity (HCC) -   gained 9 lbs in 3 months  Female bladder prolapse -  follows up with urology   Generalized weakness -  has Home health PT/OT Q 2 weeks, now walking with walker     Wt Readings from Last 3 Encounters:  08/22/23 259 lb (117.5 kg)  07/15/23 263 lb 9.6 oz (119.6 kg)  02/17/22 250 lb (113.4 kg)     Past Medical History:  Diagnosis Date   Anemia    w/pregnancy, no current problems   Arthritis    severe   History of kidney stones    passed stones    Past Surgical History:  Procedure Laterality Date   ABDOMINAL HYSTERECTOMY  06/07/2014   COLONOSCOPY      Allergies  Allergen Reactions   No Known Allergies     Outpatient Encounter Medications as of 08/22/2023  Medication Sig   acetaminophen (TYLENOL) 650 MG CR tablet Take 650 mg by mouth every 8 (eight) hours as needed for pain.   Menthol-Camphor (TIGER BALM  ARTHRITIS RUB) 11-11 % CREA Apply 1 application topically daily as needed (pain).   metoprolol tartrate (LOPRESSOR) 25 MG tablet Take 1 tablet (25 mg total) by mouth daily.   naproxen sodium (ALEVE) 220 MG tablet Take 220 mg by mouth daily as needed (Pain).   No facility-administered encounter medications on file as of 08/22/2023.    Review of Systems:  Review of Systems  Constitutional:  Negative for activity change, appetite change, chills, fatigue and fever.  HENT:  Negative for congestion, hearing loss, rhinorrhea and sore throat.   Eyes: Negative.   Respiratory:  Negative for cough, shortness of breath and wheezing.   Cardiovascular:  Negative for chest pain, palpitations and leg swelling.  Gastrointestinal:  Negative for abdominal distention, abdominal pain, constipation, diarrhea, nausea and vomiting.  Genitourinary:  Negative for difficulty urinating and flank pain.  Musculoskeletal:  Negative for arthralgias, back pain and myalgias.  Skin:  Negative for color change, rash and wound.  Neurological:  Negative for dizziness, weakness and headaches.  Psychiatric/Behavioral:  Negative for behavioral problems. The patient is not nervous/anxious.     Health Maintenance  Topic Date Due   Medicare Annual Wellness (AWV)  Never done   MAMMOGRAM  Never done   Zoster Vaccines- Shingrix (1 of  2) Never done   PAP SMEAR-Modifier  05/13/2017   COVID-19 Vaccine (1 - 2023-24 season) Never done   INFLUENZA VACCINE  07/28/2023   Colonoscopy  09/04/2024   DTaP/Tdap/Td (2 - Td or Tdap) 05/27/2033   Hepatitis C Screening  Completed   HIV Screening  Completed   HPV VACCINES  Aged Out    Physical Exam: Vitals:   08/22/23 1022  Height: 5\' 5"  (1.651 m)   Body mass index is 43.87 kg/m. Physical Exam Constitutional:      General: She is not in acute distress.    Appearance: She is obese.     Comments: Morbidly obese  HENT:     Head: Normocephalic and atraumatic.     Nose: Nose normal.      Mouth/Throat:     Mouth: Mucous membranes are moist.  Eyes:     Conjunctiva/sclera: Conjunctivae normal.  Cardiovascular:     Rate and Rhythm: Regular rhythm. Tachycardia present.  Pulmonary:     Effort: Pulmonary effort is normal.     Breath sounds: Normal breath sounds.  Abdominal:     General: Bowel sounds are normal.     Palpations: Abdomen is soft.  Musculoskeletal:        General: Normal range of motion.     Cervical back: Normal range of motion.  Skin:    General: Skin is warm and dry.  Neurological:     General: No focal deficit present.     Mental Status: She is alert and oriented to person, place, and time.  Psychiatric:        Mood and Affect: Mood normal.        Behavior: Behavior normal.        Thought Content: Thought content normal.        Judgment: Judgment normal.     Labs reviewed: Basic Metabolic Panel: Recent Labs    07/19/23 0936  NA 140  K 3.8  CL 101  CO2 27  GLUCOSE 111*  BUN 15  CREATININE 0.42*  CALCIUM 8.8   Liver Function Tests: Recent Labs    07/19/23 0936  AST 16  ALT 53*  BILITOT 0.4  PROT 6.7   No results for input(s): "LIPASE", "AMYLASE" in the last 8760 hours. No results for input(s): "AMMONIA" in the last 8760 hours. CBC: Recent Labs    07/19/23 0936  WBC 9.8  NEUTROABS 7,193  HGB 12.4  HCT 38.2  MCV 83.0  PLT 401*   Lipid Panel: Recent Labs    07/19/23 0936  CHOL 164  HDL 43*  LDLCALC 97  TRIG 161  CHOLHDL 3.8   Lab Results  Component Value Date   HGBA1C 5.6 07/19/2023    Procedures since last visit: XR Knee 1-2 Views Left  Result Date: 07/27/2023 2 views of the left knee show severe tricompartmental arthritis.  XR Knee 1-2 Views Right  Result Date: 07/27/2023 2 views of the right knee show severe tricompartment arthritis   Assessment/Plan  1. Tachycardia -  discussed need to take medication routinely - metoprolol tartrate (LOPRESSOR) 25 MG tablet; Take 0.5 tablets (12.5 mg total) by  mouth 2 (two) times daily.  2. Morbid obesity (HCC) Body mass index is 43.1 kg/m. -  plans to enrol at [facility] to do pool exercises -   plans to cut salt, sugar and increase vegetable intake  3. Female bladder prolapse -   follows up with urology  4. Generalized weakness -  has home health  PT and OT Q 2 weeks -   walks with walker at home  5. Urinary incontinence, unspecified type -  follows up with [facility]    Labs/tests ordered:  None   Next appt:  Visit date not found

## 2023-08-23 DIAGNOSIS — J90 Pleural effusion, not elsewhere classified: Secondary | ICD-10-CM | POA: Diagnosis not present

## 2023-08-23 DIAGNOSIS — I2489 Other forms of acute ischemic heart disease: Secondary | ICD-10-CM | POA: Diagnosis not present

## 2023-08-23 DIAGNOSIS — K5289 Other specified noninfective gastroenteritis and colitis: Secondary | ICD-10-CM | POA: Diagnosis not present

## 2023-08-23 DIAGNOSIS — Z9181 History of falling: Secondary | ICD-10-CM | POA: Diagnosis not present

## 2023-08-23 DIAGNOSIS — R1310 Dysphagia, unspecified: Secondary | ICD-10-CM | POA: Diagnosis not present

## 2023-08-23 DIAGNOSIS — G9341 Metabolic encephalopathy: Secondary | ICD-10-CM | POA: Diagnosis not present

## 2023-08-23 DIAGNOSIS — N390 Urinary tract infection, site not specified: Secondary | ICD-10-CM | POA: Diagnosis not present

## 2023-08-23 DIAGNOSIS — M1990 Unspecified osteoarthritis, unspecified site: Secondary | ICD-10-CM | POA: Diagnosis not present

## 2023-08-23 DIAGNOSIS — E1165 Type 2 diabetes mellitus with hyperglycemia: Secondary | ICD-10-CM | POA: Diagnosis not present

## 2023-08-23 DIAGNOSIS — Z960 Presence of urogenital implants: Secondary | ICD-10-CM | POA: Diagnosis not present

## 2023-08-24 DIAGNOSIS — I2489 Other forms of acute ischemic heart disease: Secondary | ICD-10-CM | POA: Diagnosis not present

## 2023-08-24 DIAGNOSIS — E1165 Type 2 diabetes mellitus with hyperglycemia: Secondary | ICD-10-CM | POA: Diagnosis not present

## 2023-08-24 DIAGNOSIS — M1990 Unspecified osteoarthritis, unspecified site: Secondary | ICD-10-CM | POA: Diagnosis not present

## 2023-08-24 DIAGNOSIS — N390 Urinary tract infection, site not specified: Secondary | ICD-10-CM | POA: Diagnosis not present

## 2023-08-24 DIAGNOSIS — R1310 Dysphagia, unspecified: Secondary | ICD-10-CM | POA: Diagnosis not present

## 2023-08-24 DIAGNOSIS — J90 Pleural effusion, not elsewhere classified: Secondary | ICD-10-CM | POA: Diagnosis not present

## 2023-08-24 DIAGNOSIS — G9341 Metabolic encephalopathy: Secondary | ICD-10-CM | POA: Diagnosis not present

## 2023-08-24 DIAGNOSIS — Z960 Presence of urogenital implants: Secondary | ICD-10-CM | POA: Diagnosis not present

## 2023-08-24 DIAGNOSIS — Z9181 History of falling: Secondary | ICD-10-CM | POA: Diagnosis not present

## 2023-08-24 DIAGNOSIS — K5289 Other specified noninfective gastroenteritis and colitis: Secondary | ICD-10-CM | POA: Diagnosis not present

## 2023-08-31 DIAGNOSIS — R1310 Dysphagia, unspecified: Secondary | ICD-10-CM | POA: Diagnosis not present

## 2023-08-31 DIAGNOSIS — G9341 Metabolic encephalopathy: Secondary | ICD-10-CM | POA: Diagnosis not present

## 2023-08-31 DIAGNOSIS — Z960 Presence of urogenital implants: Secondary | ICD-10-CM | POA: Diagnosis not present

## 2023-08-31 DIAGNOSIS — N390 Urinary tract infection, site not specified: Secondary | ICD-10-CM | POA: Diagnosis not present

## 2023-08-31 DIAGNOSIS — J90 Pleural effusion, not elsewhere classified: Secondary | ICD-10-CM | POA: Diagnosis not present

## 2023-08-31 DIAGNOSIS — E1165 Type 2 diabetes mellitus with hyperglycemia: Secondary | ICD-10-CM | POA: Diagnosis not present

## 2023-08-31 DIAGNOSIS — I2489 Other forms of acute ischemic heart disease: Secondary | ICD-10-CM | POA: Diagnosis not present

## 2023-08-31 DIAGNOSIS — K5289 Other specified noninfective gastroenteritis and colitis: Secondary | ICD-10-CM | POA: Diagnosis not present

## 2023-08-31 DIAGNOSIS — Z9181 History of falling: Secondary | ICD-10-CM | POA: Diagnosis not present

## 2023-08-31 DIAGNOSIS — M1990 Unspecified osteoarthritis, unspecified site: Secondary | ICD-10-CM | POA: Diagnosis not present

## 2023-09-01 DIAGNOSIS — R8279 Other abnormal findings on microbiological examination of urine: Secondary | ICD-10-CM | POA: Diagnosis not present

## 2023-10-21 ENCOUNTER — Encounter (INDEPENDENT_AMBULATORY_CARE_PROVIDER_SITE_OTHER): Payer: 59 | Admitting: Adult Health

## 2023-10-23 NOTE — Progress Notes (Signed)
This encounter was created in error - please disregard.

## 2023-10-27 ENCOUNTER — Encounter: Payer: Self-pay | Admitting: Adult Health

## 2023-10-27 ENCOUNTER — Ambulatory Visit (INDEPENDENT_AMBULATORY_CARE_PROVIDER_SITE_OTHER): Payer: 59 | Admitting: Adult Health

## 2023-10-27 VITALS — BP 124/78 | HR 117 | Resp 18 | Ht 65.0 in | Wt 250.0 lb

## 2023-10-27 DIAGNOSIS — R32 Unspecified urinary incontinence: Secondary | ICD-10-CM

## 2023-10-27 DIAGNOSIS — Z91199 Patient's noncompliance with other medical treatment and regimen due to unspecified reason: Secondary | ICD-10-CM

## 2023-10-27 DIAGNOSIS — R000 Tachycardia, unspecified: Secondary | ICD-10-CM | POA: Diagnosis not present

## 2023-10-27 MED ORDER — METOPROLOL TARTRATE 25 MG PO TABS
12.5000 mg | ORAL_TABLET | Freq: Two times a day (BID) | ORAL | 3 refills | Status: DC
Start: 2023-10-27 — End: 2024-07-16

## 2023-10-27 NOTE — Progress Notes (Signed)
[facility] clinic  Provider: Kenard Gower DNP  Code Status:  Full Code  Goals of Care:     08/22/2023   10:29 AM  Advanced Directives  Does Patient Have a Medical Advance Directive? No  Would patient like information on creating a medical advance directive? No - Patient declined     Chief Complaint  Patient presents with   Immunizations    Covid and  Shingrix   Quality Metric Gaps    Cervical Cancer Screening   Medical Management of Chronic Issues    HPI: Patient is a 62 y.o. female seen today for an acute visit for  management of chronic medical issues.  She was accompanied today by her daughter. She has a caregiver in the morning.  Patient lives with daughter.  Tachycardia -  HR 117,  not taking metoprolol tartrate, daughter stated " you can give water for 2 hours but you can make the course drink the water"  Morbid obesity (HCC) -  had 13 lbs weight loss since 06/2023 Wt Readings from Last 3 Encounters:  10/27/23 250 lb (113.4 kg)  08/22/23 259 lb (117.5 kg)  07/15/23 263 lb 9.6 oz (119.6 kg)    Urinary incontinence, unspecified type - will follow up with urogynecology, wears pull ups, has prolapse bladder per daughter    Past Medical History:  Diagnosis Date   Anemia    w/pregnancy, no current problems   Arthritis    severe   History of kidney stones    passed stones    Past Surgical History:  Procedure Laterality Date   ABDOMINAL HYSTERECTOMY  06/07/2014   COLONOSCOPY      Allergies  Allergen Reactions   No Known Allergies     Outpatient Encounter Medications as of 10/27/2023  Medication Sig   acetaminophen (TYLENOL) 650 MG CR tablet Take 650 mg by mouth every 8 (eight) hours as needed for pain.   Menthol-Camphor (TIGER BALM ARTHRITIS RUB) 11-11 % CREA Apply 1 application topically daily as needed (pain).   naproxen sodium (ALEVE) 220 MG tablet Take 220 mg by mouth daily as needed (Pain).   metoprolol tartrate (LOPRESSOR) 25 MG tablet Take 0.5  tablets (12.5 mg total) by mouth 2 (two) times daily.   [DISCONTINUED] metoprolol tartrate (LOPRESSOR) 25 MG tablet Take 0.5 tablets (12.5 mg total) by mouth 2 (two) times daily. (Patient not taking: Reported on 10/27/2023)   No facility-administered encounter medications on file as of 10/27/2023.    Review of Systems:  Review of Systems  Constitutional:  Negative for appetite change, chills, fatigue and fever.  HENT:  Negative for congestion, hearing loss, rhinorrhea and sore throat.   Eyes: Negative.   Respiratory:  Negative for cough, shortness of breath and wheezing.   Cardiovascular:  Positive for leg swelling. Negative for chest pain and palpitations.  Gastrointestinal:  Negative for abdominal pain, constipation, diarrhea, nausea and vomiting.  Genitourinary:  Negative for dysuria.  Musculoskeletal:  Negative for arthralgias, back pain and myalgias.  Skin:  Negative for color change, rash and wound.  Neurological:  Negative for dizziness, weakness and headaches.  Psychiatric/Behavioral:  Negative for behavioral problems. The patient is not nervous/anxious.     Health Maintenance  Topic Date Due   Medicare Annual Wellness (AWV)  Never done   COVID-19 Vaccine (1) Never done   Zoster Vaccines- Shingrix (1 of 2) Never done   MAMMOGRAM  Never done   INFLUENZA VACCINE  03/26/2024 (Originally 07/28/2023)   Cervical Cancer Screening (  HPV/Pap Cotest)  10/26/2024 (Originally 05/13/2017)   Colonoscopy  09/04/2024   DTaP/Tdap/Td (2 - Td or Tdap) 05/27/2033   Hepatitis C Screening  Completed   HIV Screening  Completed   HPV VACCINES  Aged Out    Physical Exam: Vitals:   10/27/23 1317  BP: 124/78  Pulse: (!) 117  Resp: 18  SpO2: 90%  Weight: 250 lb (113.4 kg)  Height: 5\' 5"  (1.651 m)   Body mass index is 41.6 kg/m. Physical Exam Constitutional:      General: She is not in acute distress.    Appearance: She is obese.     Comments: Morbidly obese  HENT:     Head:  Normocephalic and atraumatic.     Nose: Nose normal.     Mouth/Throat:     Mouth: Mucous membranes are moist.  Eyes:     Conjunctiva/sclera: Conjunctivae normal.  Cardiovascular:     Rate and Rhythm: Regular rhythm. Tachycardia present.  Pulmonary:     Effort: Pulmonary effort is normal.     Breath sounds: Normal breath sounds.  Abdominal:     General: Bowel sounds are normal.     Palpations: Abdomen is soft.  Musculoskeletal:        General: Normal range of motion.     Cervical back: Normal range of motion.     Right lower leg: Edema present.     Left lower leg: Edema present.     Comments: BLE 1+ankle edema  Skin:    General: Skin is warm and dry.  Neurological:     General: No focal deficit present.     Mental Status: She is alert and oriented to person, place, and time.  Psychiatric:        Mood and Affect: Mood normal.        Behavior: Behavior normal.        Thought Content: Thought content normal.        Judgment: Judgment normal.     Labs reviewed: Basic Metabolic Panel: Recent Labs    07/19/23 0936  NA 140  K 3.8  CL 101  CO2 27  GLUCOSE 111*  BUN 15  CREATININE 0.42*  CALCIUM 8.8   Liver Function Tests: Recent Labs    07/19/23 0936  AST 16  ALT 53*  BILITOT 0.4  PROT 6.7   No results for input(s): "LIPASE", "AMYLASE" in the last 8760 hours. No results for input(s): "AMMONIA" in the last 8760 hours. CBC: Recent Labs    07/19/23 0936  WBC 9.8  NEUTROABS 7,193  HGB 12.4  HCT 38.2  MCV 83.0  PLT 401*   Lipid Panel: Recent Labs    07/19/23 0936  CHOL 164  HDL 43*  LDLCALC 97  TRIG 295  CHOLHDL 3.8   Lab Results  Component Value Date   HGBA1C 5.6 07/19/2023    Procedures since last visit: No results found.  Assessment/Plan  1. Tachycardia -  continue Metoprolol tartrate - metoprolol tartrate (LOPRESSOR) 25 MG tablet; Take 0.5 tablets (12.5 mg total) by mouth 2 (two) times daily.  Dispense: 45 tablet; Refill: 3  2. Morbid  obesity (HCC) -  had 13 lbs weight loss since 06/2023 -  discussed exercise and diet -  plans to attend [employer] exercises  3. Urinary incontinence, unspecified type -  will follow up with urogynecology  4. Noncompliance -  discussed importance of taking Metoprolol tartrate     Labs/tests ordered:   None  Next appt:  11/21/2023  

## 2023-10-28 ENCOUNTER — Ambulatory Visit: Payer: 59 | Admitting: Adult Health

## 2023-11-21 ENCOUNTER — Ambulatory Visit: Payer: 59 | Admitting: Adult Health

## 2023-12-19 DIAGNOSIS — N8111 Cystocele, midline: Secondary | ICD-10-CM | POA: Diagnosis not present

## 2023-12-27 ENCOUNTER — Telehealth: Payer: Self-pay

## 2023-12-27 NOTE — Telephone Encounter (Signed)
Video visit scheduled for Thursday at 1 pm

## 2023-12-27 NOTE — Telephone Encounter (Signed)
 Patients daughter called requesting a letter indicating patients hip issues and mobility limitations addressed to her apartment complex, so that they will replace her toilet which is low to the ground with a higher positioned toilit.  Miranda emphasized that patient has falling in the past trying to get on and off toilet as a result of it being so low to the ground. Miranda would like a call to pick up letter once completed

## 2023-12-27 NOTE — Telephone Encounter (Signed)
Medina-Vargas, Monina C, NP  You16 minutes ago (10:31 AM)    Can she have a visit so we can put all the concerns on the letter?  Monina

## 2023-12-29 ENCOUNTER — Encounter: Payer: Self-pay | Admitting: Adult Health

## 2023-12-29 ENCOUNTER — Telehealth: Payer: 59 | Admitting: Adult Health

## 2023-12-29 DIAGNOSIS — Q6589 Other specified congenital deformities of hip: Secondary | ICD-10-CM

## 2023-12-29 DIAGNOSIS — M1712 Unilateral primary osteoarthritis, left knee: Secondary | ICD-10-CM

## 2023-12-29 DIAGNOSIS — R000 Tachycardia, unspecified: Secondary | ICD-10-CM | POA: Diagnosis not present

## 2023-12-29 NOTE — Progress Notes (Signed)
This service is provided via telemedicine  No vital signs collected/recorded due to the encounter was a telemedicine visit.   Location of patient (ex: home, work):  Home   Patient consents to a telephone visit:  Yes, see telephone encounter dated   Location of the provider (ex: office, home):  [facility]  Name of any referring provider:  N/A  Names of all persons participating in the telemedicine service and their role in the encounter: Mariah Morrison B/CMA, Windell Moulding, NP, and patient  Time spent on call:  11 minutes

## 2023-12-29 NOTE — Progress Notes (Signed)
 I connected with  Mariah Morrison on 12/29/23 by a video enabled telemedicine application and verified that I am speaking with the correct person using two identifiers.   I discussed the limitations of evaluation and management by telemedicine. The patient expressed understanding and agreed to proceed.      DATE:  12/29/2023 MRN:  [id number]  BIRTHDAY: Jan 27, 1961   Contact Information     Name Relation Home Work Mobile   [place] Daughter [phone]  [phone]      Other Contacts   None on File         Chief Complaint  Patient presents with   Acute Visit    Patient is requesting a letter to present to her apartment complex to request a higher toilet. Visit performed via telephone/video.     HISTORY OF PRESENT ILLNESS: This is a 63 year old female who had a video visit for accomodation regarding toilet seat. 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 She needs a higher toilet seat for safety.  The current toilet seat she has a stool no for her.  She is not able to bend or get down to the toilet seat.  Daughter tried to use a portable commode that she has to put back and forth whenever the patient uses the toilet.  The tasks of putting portable toilet seat back and forth is too taxing.  A current toilet seat shaped oval and does not fit well with portable commodes which are usually rounded.  She has fallen before and high risk for falls. Daughter is requesting a letter of accomodation for the leasing agent.   Now weighing 230 lbs  PAST MEDICAL HISTORY:  Past Medical History:  Diagnosis Date   Anemia    w/pregnancy, no current problems   Arthritis    severe   History of kidney stones    passed stones     CURRENT MEDICATIONS: Reviewed  Patient's Medications  New Prescriptions   No medications on file  Previous Medications   ACETAMINOPHEN  (TYLENOL ) 650 MG CR TABLET    Take 650 mg by mouth every 8 (eight) hours as needed for pain.    MENTHOL-CAMPHOR (TIGER BALM ARTHRITIS RUB) 11-11 % CREA    Apply 1 application topically daily as needed (pain).   METOPROLOL  TARTRATE (LOPRESSOR ) 25 MG TABLET    Take 0.5 tablets (12.5 mg total) by mouth 2 (two) times daily.   NAPROXEN SODIUM (ALEVE) 220 MG TABLET    Take 220 mg by mouth daily as needed (Pain).  Modified Medications   No medications on file  Discontinued Medications   No medications on file     Allergies  Allergen Reactions   No Known Allergies      REVIEW OF SYSTEMS:  GENERAL: no change in appetite, no fatigue, no weight changes, no fever, chills or weakness SKIN: Denies rash, itching, wounds, ulcer sores, or nail abnormality EYES: Denies change in vision, dry eyes, eye pain, itching or discharge EARS: Denies change in hearing, ringing in ears, or earache NOSE: Denies nasal congestion or epistaxis MOUTH and THROAT: Denies oral discomfort, gingival pain or bleeding, pain from teeth or hoarseness   RESPIRATORY: no cough, SOB, DOE, wheezing, hemoptysis CARDIAC: no chest pain, edema or palpitations GI: no abdominal pain, diarrhea, constipation, heart burn, nausea or vomiting GU: Denies dysuria, frequency, hematuria, incontinence, or discharge NEUROLOGICAL: Denies dizziness, syncope, numbness, or headache PSYCHIATRIC: Denies feeling of depression or anxiety. No report of hallucinations, insomnia, paranoia, or agitation  LABS/RADIOLOGY: Labs reviewed: Basic Metabolic Panel: Recent Labs    07/19/23 0936  NA 140  K 3.8  CL 101  CO2 27  GLUCOSE 111*  BUN 15  CREATININE 0.42*  CALCIUM 8.8   Liver Function Tests: Recent Labs    07/19/23 0936  AST 16  ALT 53*  BILITOT 0.4  PROT 6.7   No results for input(s): LIPASE, AMYLASE in the last 8760 hours. No results for input(s): AMMONIA in the last 8760 hours. CBC: Recent Labs    07/19/23 0936  WBC 9.8  NEUTROABS 7,193  HGB 12.4  HCT 38.2  MCV 83.0  PLT 401*   A1C: Invalid input(s):  A1C Lipid Panel: Recent Labs    07/19/23 0936  HDL 43*   Cardiac Enzymes: No results for input(s): CKTOTAL, CKMB, CKMBINDEX, TROPONINI in the last 8760 hours. BNP: Invalid input(s): POCBNP CBG: No results for input(s): GLUCAP in the last 8760 hours.    No results found.  ASSESSMENT/PLAN:  1. 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 Morbid obesity (HCC) -  counseled  4. Tachycardia -  stable -  continue Metoprolol  tartrate      Time spent on non face to face visit:  13 minutes  The patient gave consent to this video visit. Explained to the patient the risk and privacy issue that was involved with this video call.   The patient was advised to call back and ask for an in-person evaluation if the symptoms worsen or if the condition fails to improve.   Giavanni Zeitlin Medina-Vargas, NP [employer] [phone]

## 2024-01-03 NOTE — Addendum Note (Signed)
 Addended by: Kenard Gower C on: 01/03/2024 03:23 PM   Modules accepted: Level of Service

## 2024-01-27 ENCOUNTER — Encounter: Payer: 59 | Admitting: Adult Health

## 2024-01-27 NOTE — Progress Notes (Signed)
 No show  This encounter was created in error - please disregard.

## 2024-02-21 ENCOUNTER — Telehealth: Payer: Self-pay | Admitting: Emergency Medicine

## 2024-02-21 DIAGNOSIS — R32 Unspecified urinary incontinence: Secondary | ICD-10-CM

## 2024-02-21 MED ORDER — DEPEND UNDERWEAR X-LARGE MISC: 1.0000 | 11 refills | Status: AC

## 2024-02-21 NOTE — Telephone Encounter (Signed)
 Rosanne Ashing with [facility] called stating that the order that we are going to send is unacceptable and we will need to complete order that they are faxing with there letterhead per the insurance    We can expect fax soon

## 2024-02-21 NOTE — Telephone Encounter (Signed)
 Patient called requesting order be sent to [facility]  Fax : [phone]

## 2024-03-12 ENCOUNTER — Ambulatory Visit (INDEPENDENT_AMBULATORY_CARE_PROVIDER_SITE_OTHER): Admitting: Orthopaedic Surgery

## 2024-03-12 DIAGNOSIS — M25551 Pain in right hip: Secondary | ICD-10-CM

## 2024-03-12 DIAGNOSIS — M25552 Pain in left hip: Secondary | ICD-10-CM

## 2024-03-12 MED ORDER — METHYLPREDNISOLONE ACETATE 40 MG/ML IJ SUSP
40.0000 mg | INTRAMUSCULAR | Status: AC | PRN
  Administered 2024-03-12: 40 mg via INTRA_ARTICULAR

## 2024-03-12 MED ORDER — LIDOCAINE HCL 1 % IJ SOLN
3.0000 mL | INTRAMUSCULAR | Status: AC | PRN
  Administered 2024-03-12: 3 mL

## 2024-03-12 NOTE — Progress Notes (Signed)
 The patient is a 63 year old female who comes in today requesting steroid injections on the lateral part of both of her hips.  She has severe hip dysplasia and her hips are so deformed that hip replacement surgery is not an option.  She unfortunately is having bladder prolapse surgery coming up and she would like to have steroid injections on the lateral aspect of both her hips to see if this would help at least for pain control standpoint for the manipulation that is going to have to be done for her hips to get to her bladder.  Previous x-rays of both hips show severe deformities of both hips that are significant in terms of the dysplastic aspect of both hips.  The hips are significantly deformed.  Examination shows that she has limited motion of both hips.  There is pain to the trochanteric area on exam of both hips.  Per her request I did provide a steroid injection on both her hips over the trochanteric areas which she tolerated well.  Follow-up is as needed.  We have seen her for her knees before.  We can certainly see her again for hyaluronic acid injections in both knees at the distal where it is bothering her enough.    Procedure Note  Patient: Mariah Morrison             Date of Birth: Jul 11, 1961           MRN: [id number]             Visit Date: 03/12/2024  Procedures: Visit Diagnoses:  1. Bilateral hip pain     Large Joint Inj: R greater trochanter on 03/12/2024 3:34 PM Indications: pain and diagnostic evaluation Details: 22 G 1.5 in needle, lateral approach  Arthrogram: No  Medications: 3 mL lidocaine 1 %; 40 mg methylPREDNISolone acetate 40 MG/ML Outcome: tolerated well, no immediate complications Procedure, treatment alternatives, risks and benefits explained, specific risks discussed. Consent was given by the patient. Immediately prior to procedure a time out was called to verify the correct patient, procedure, equipment, support staff and site/side marked as required. Patient  was prepped and draped in the usual sterile fashion.    Large Joint Inj: L greater trochanter on 03/12/2024 3:34 PM Indications: pain and diagnostic evaluation Details: 22 G 1.5 in needle, lateral approach  Arthrogram: No  Medications: 3 mL lidocaine 1 %; 40 mg methylPREDNISolone acetate 40 MG/ML Outcome: tolerated well, no immediate complications Procedure, treatment alternatives, risks and benefits explained, specific risks discussed. Consent was given by the patient. Immediately prior to procedure a time out was called to verify the correct patient, procedure, equipment, support staff and site/side marked as required. Patient was prepped and draped in the usual sterile fashion.

## 2024-03-16 ENCOUNTER — Ambulatory Visit: Payer: Self-pay

## 2024-03-16 NOTE — Telephone Encounter (Signed)
 Pls follow up in clinic as scheduled or sooner if needed to address issues.

## 2024-03-16 NOTE — Telephone Encounter (Signed)
Message routed to PCP Medina-Vargas, Monina C, NP  

## 2024-03-16 NOTE — Telephone Encounter (Signed)
 Chief Complaint: diarrhea Symptoms: watery bowel movements every 2 days Frequency: daughter wouldn't give any specifics for time. Stated "going on for awhile" Pertinent Negatives: Patient denies fever, abdominal pain, vomiting Disposition: [] ED /[] Urgent Care (no appt availability in office) / [x] Appointment(In office/virtual)/ []  [facility]/ [] Home Care/ [] Refused Recommended Disposition /[] [place] [facility]/ []  Follow-up with PCP Additional Notes: Patient's daughter phone number was listed as the primary number for patient. Daughter is listed as DPR. Daughter states patient is having watery bowel movements every 2 days. Daughter states she is having to clean her up every time. Daughter is also concerned for possible UTI-states patient's urine smells terrible. Patient is eating and drinking without any issue. No abdominal pain or vomiting. Per protocol, Appointment made for next Thursday 03/22/2024 at 2:40 pm with PCP. Patient's daughter verbalized understanding of plan and all questions answered.     Copied From CRM [id number]. Reason for Triage: Diarrhea   Patient stated during scheduling her leaking/ incontinence has been a issue for quite a while but the need for surgery has now began to cause her to have diarrhea in the same uncontrollable manner. Patient believe the issue is still related to the surgery. Agent wanted to ensure to send over a message just in cause this is a developing issue that requires further triage.   Patient called in stating that she is looking to schedule a pre-op appointment for her COLPORRHAPHY ANTERIOR AND POSTERIOR, OBLITERATIVE, CYSTOSCOPY procedure. Patient was scheduled for 03/30/2024 at 11:00 AM EST due to her daughter's work schedule.  Reason for Disposition  Diarrhea is a chronic symptom (recurrent or ongoing AND present > 4 weeks)  Answer Assessment - Initial Assessment Questions 1. DIARRHEA SEVERITY: "How bad is the diarrhea?" "How many  more stools have you had in the past 24 hours than normal?"    - NO DIARRHEA (SCALE 0)   - MILD (SCALE 1-3): Few loose or mushy BMs; increase of 1-3 stools over normal daily number of stools; mild increase in ostomy output.   -  MODERATE (SCALE 4-7): Increase of 4-6 stools daily over normal; moderate increase in ostomy output.   -  SEVERE (SCALE 8-10; OR "WORST POSSIBLE"): Increase of 7 or more stools daily over normal; moderate increase in ostomy output; incontinence.     Mild 2. ONSET: "When did the diarrhea begin?"      "Any time she has a bowel movement-not having solid bowel movement-daughter wouldn't give any specifics.  3. BM CONSISTENCY: "How loose or watery is the diarrhea?"      watery 4. VOMITING: "Are you also vomiting?" If Yes, ask: "How many times in the past 24 hours?"      No 5. ABDOMEN PAIN: "Are you having any abdomen pain?" If Yes, ask: "What does it feel like?" (e.g., crampy, dull, intermittent, constant)      No 6. ABDOMEN PAIN SEVERITY: If present, ask: "How bad is the pain?"  (e.g., Scale 1-10; mild, moderate, or severe)   - MILD (1-3): doesn't interfere with normal activities, abdomen soft and not tender to touch    - MODERATE (4-7): interferes with normal activities or awakens from sleep, abdomen tender to touch    - SEVERE (8-10): excruciating pain, doubled over, unable to do any normal activities       No 7. ORAL INTAKE: If vomiting, "Have you been able to drink liquids?" "How much liquids have you had in the past 24 hours?"     yes 8.  HYDRATION: "Any signs of dehydration?" (e.g., dry mouth [not just dry lips], too weak to stand, dizziness, new weight loss) "When did you last urinate?"     no 9. EXPOSURE: "Have you traveled to a foreign country recently?" "Have you been exposed to anyone with diarrhea?" "Could you have eaten any food that was spoiled?"     No 10. ANTIBIOTIC USE: "Are you taking antibiotics now or have you taken antibiotics in the past 2 months?"        No 11. OTHER SYMPTOMS: "Do you have any other symptoms?" (e.g., fever, blood in stool)       No  Protocols used: Diarrhea-A-AH

## 2024-03-16 NOTE — Telephone Encounter (Signed)
 I called and spoke to patient daughter Tamera Punt she states that 03/22/2024 appointment is fine and she doesn't need to be seen sooner.

## 2024-03-22 ENCOUNTER — Encounter: Admitting: Adult Health

## 2024-03-26 NOTE — Progress Notes (Signed)
 This encounter was created in error - please disregard.

## 2024-03-30 ENCOUNTER — Ambulatory Visit: Admitting: Adult Health

## 2024-04-06 DIAGNOSIS — N8110 Cystocele, unspecified: Secondary | ICD-10-CM | POA: Diagnosis not present

## 2024-04-09 DIAGNOSIS — N200 Calculus of kidney: Secondary | ICD-10-CM | POA: Diagnosis not present

## 2024-04-17 DIAGNOSIS — Z9889 Other specified postprocedural states: Secondary | ICD-10-CM | POA: Diagnosis not present

## 2024-05-03 ENCOUNTER — Encounter: Admitting: Adult Health

## 2024-05-04 NOTE — Progress Notes (Signed)
 This encounter was created in error - please disregard.

## 2024-05-07 ENCOUNTER — Telehealth: Payer: Self-pay | Admitting: Orthopaedic Surgery

## 2024-05-07 NOTE — Telephone Encounter (Signed)
 LMOM for patient letting her know she can take anything over the counter that works for her pain

## 2024-05-07 NOTE — Telephone Encounter (Signed)
 Patient wants to know which medication (Advil, Motrin or Aleve) she should take to help with her arthritis pain

## 2024-05-08 ENCOUNTER — Ambulatory Visit: Payer: Self-pay

## 2024-05-08 ENCOUNTER — Ambulatory Visit: Payer: Self-pay | Admitting: *Deleted

## 2024-05-08 NOTE — Telephone Encounter (Signed)
 If patient does not get better, pls schedule a visit in the clinic.

## 2024-05-08 NOTE — Telephone Encounter (Signed)
 3rd attempt to contact patient to review sx . Call answered and then immediately hung up before NT could state call from [facility]. Chief Complaint: allergies, congestion Symptoms: see above  Frequency: na Pertinent Negatives: Patient denies na Disposition: [] ED /[] Urgent Care (no appt availability in office) / [] Appointment(In office/virtual)/ []  [facility]/ [] Home Care/ [] Refused Recommended Disposition /[] [place] [facility]/ [x]  Follow-up with PCP Additional Notes:    Summary: Congestion, symptomatic, allergies   Copied From CRM [id number]. Reason for Triage: Allergies, symptomatic, wants to speak to a nurse.  Best contact: [phone]            Reason for Disposition  Third attempt to contact caller AND no contact made. Phone number verified.  Answer Assessment - Initial Assessment Questions N/A 3rd attempt to contact patient to review sx of allergies, congestion. Call answered by then hung up before any questions could be asked.  Protocols used: No Contact or Duplicate Contact Call-A-AH

## 2024-05-08 NOTE — Telephone Encounter (Signed)
Message routed to PCP Medina-Vargas, Monina C, NP  

## 2024-05-08 NOTE — Telephone Encounter (Signed)
 Called patient to review sx of allergies, no answer, LVMTCB # [phone].   Summary: Congestion, symptomatic, allergies   Copied From CRM [id number]. Reason for Triage: Allergies, symptomatic, wants to speak to a nurse.  Best contact: [phone]

## 2024-05-08 NOTE — Telephone Encounter (Signed)
 2 nd attempt to contact patient to review sx of allergies. Call answered and then hung up. Unable to leave message to call back.

## 2024-05-08 NOTE — Telephone Encounter (Signed)
 1st attempt, no answer, left voicemail for patient to return call for nurse triage.  Copied from CRM [id number]. Topic: Clinical - Medical Advice >> May 08, 2024 11:18 AM Brynn Caras wrote: Reason for CRM: The patient is experiencing seasonal allergies - sore throat, stuffy nose, and constant sneezing. She declined scheduling an appointment and would like the best recommendation for over-the-counter medicine she can purchase to help relieve her symptoms.  Callback #:[phone]

## 2024-05-08 NOTE — Telephone Encounter (Signed)
 Chief Complaint: Allergies Symptoms: runny nose, fatigue, sneezing Frequency: "pollen season" Pertinent Negatives: Patient denies fever, sinus pain and pressure Disposition: [] ED /[] Urgent Care (no appt availability in office) / [] Appointment(In office/virtual)/ []  [facility] [facility]/ [x] Home Care/ [] Refused Recommended Disposition /[]  Mobile Bus/ []  Follow-up with PCP Additional Notes: Patient called in looking for home/medication advice for seasonal allergies. Patient has runny nose, sneezing, fatigue and states she has this annually with pollen. Patient states she normally uses sudafed. Patient advised to try nasal rinse/neti-pot and non-drowsy antihistamine. Patient will call back if she has worsening symptoms.

## 2024-05-08 NOTE — Telephone Encounter (Signed)
 2nd attempt, no answer. Left voicemail for patient to return call for nurse triage.

## 2024-05-08 NOTE — Telephone Encounter (Signed)
 MyChart message sent to patient.

## 2024-05-08 NOTE — Telephone Encounter (Signed)
 Reason for Disposition . [1] Nasal allergies AND [2] only certain times of year (hay fever)  Answer Assessment - Initial Assessment Questions 1. SYMPTOM: "What's the main symptom you're concerned about?" (e.g., runny nose, stuffiness, sneezing, itching)     Runny nose, sneezing, fatigue 2. SEVERITY: "How bad is it?" "What does it keep you from doing?" (e.g., sleeping, working)      Worsened when going outside, better when inside 3. EYES: "Are the eyes also red, watery, and itchy?"      No 4. TRIGGER: "What pollen or other allergic substance do you think is causing the symptoms?"      Pollen 5. TREATMENT: "What medicine are you using?" "What medicine worked best in the past?"     Sudafed 6. OTHER SYMPTOMS: "Do you have any other symptoms?" (e.g., coughing, difficulty breathing, wheezing)     Mild cough, fatigue, sneezing  Protocols used: Nasal Allergies (Hay Fever)-A-AH

## 2024-05-16 ENCOUNTER — Ambulatory Visit: Admitting: Orthopaedic Surgery

## 2024-05-24 DIAGNOSIS — N8111 Cystocele, midline: Secondary | ICD-10-CM | POA: Diagnosis not present

## 2024-06-04 ENCOUNTER — Encounter: Admitting: Orthopedic Surgery

## 2024-06-25 ENCOUNTER — Encounter: Admitting: Orthopedic Surgery

## 2024-06-27 ENCOUNTER — Encounter: Payer: Self-pay | Admitting: Orthopaedic Surgery

## 2024-06-27 ENCOUNTER — Ambulatory Visit (INDEPENDENT_AMBULATORY_CARE_PROVIDER_SITE_OTHER): Admitting: Orthopaedic Surgery

## 2024-06-27 DIAGNOSIS — M25562 Pain in left knee: Secondary | ICD-10-CM | POA: Diagnosis not present

## 2024-06-27 DIAGNOSIS — G8929 Other chronic pain: Secondary | ICD-10-CM | POA: Diagnosis not present

## 2024-06-27 DIAGNOSIS — M25561 Pain in right knee: Secondary | ICD-10-CM | POA: Diagnosis not present

## 2024-06-27 MED ORDER — CELECOXIB 200 MG PO CAPS: 200.0000 mg | ORAL_CAPSULE | Freq: Two times a day (BID) | ORAL | 3 refills | Status: DC | PRN

## 2024-06-27 MED ORDER — METHYLPREDNISOLONE ACETATE 40 MG/ML IJ SUSP
40.0000 mg | INTRAMUSCULAR | Status: AC | PRN
  Administered 2024-06-27: 40 mg via INTRA_ARTICULAR

## 2024-06-27 MED ORDER — LIDOCAINE HCL 1 % IJ SOLN
3.0000 mL | INTRAMUSCULAR | Status: AC | PRN
  Administered 2024-06-27: 3 mL

## 2024-06-27 NOTE — Progress Notes (Signed)
 The patient is well-known to us .  She has severely dysplastic hips and is not a candidate for hip replacement surgery.  She is 63 years old.  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  She has been on ibuprofen and Tylenol .  Today we talked about switching her to Celebrex  to see if that will help.  She is requesting hyaluronic acid injections but her arthritis is too bad and she is on Medicaid and they will not cover those type of injections.  It has been a while since she has had steroid injections in both knees so we did recommend a steroid injection in both knees today.  She did request these and did tolerated them well.  I will send in some Celebrex  for her.  She knows to wait at least 3 months between steroid injections.    Procedure Note  Patient: Mariah Morrison             Date of Birth: [birth year]           MRN: [id number]             Visit Date: 06/27/2024  Procedures: Visit Diagnoses:  1. Chronic pain of left knee   2. Chronic pain of right knee     Large Joint Inj: R knee on 06/27/2024 10:45 AM Indications: diagnostic evaluation and pain Details: 22 G 1.5 in needle, superolateral approach  Arthrogram: No  Medications: 3 mL lidocaine  1 %; 40 mg methylPREDNISolone  acetate 40 MG/ML Outcome: tolerated well, no immediate complications Procedure, treatment alternatives, risks and benefits explained, specific risks discussed. Consent was given by the patient. Immediately prior to procedure a time out was called to verify the correct patient, procedure, equipment, support staff and site/side marked as required. Patient was prepped and draped in the usual sterile fashion.    Large Joint Inj: L knee on 06/27/2024 10:45 AM Indications: diagnostic evaluation and pain Details: 22 G 1.5 in needle, superolateral approach  Arthrogram: No  Medications: 3 mL lidocaine  1 %; 40 mg methylPREDNISolone  acetate 40 MG/ML Outcome: tolerated well, no immediate  complications Procedure, treatment alternatives, risks and benefits explained, specific risks discussed. Consent was given by the patient. Immediately prior to procedure a time out was called to verify the correct patient, procedure, equipment, support staff and site/side marked as required. Patient was prepped and draped in the usual sterile fashion.

## 2024-07-13 ENCOUNTER — Ambulatory Visit: Admitting: Adult Health

## 2024-07-13 VITALS — BP 127/78 | HR 85 | Temp 97.2°F | Resp 20 | Ht 65.0 in | Wt 273.6 lb

## 2024-07-13 DIAGNOSIS — M170 Bilateral primary osteoarthritis of knee: Secondary | ICD-10-CM

## 2024-07-13 DIAGNOSIS — R000 Tachycardia, unspecified: Secondary | ICD-10-CM | POA: Diagnosis not present

## 2024-07-13 DIAGNOSIS — N61 Mastitis without abscess: Secondary | ICD-10-CM

## 2024-07-13 MED ORDER — MUPIROCIN 2 % EX OINT: 1.0000 | TOPICAL_OINTMENT | Freq: Two times a day (BID) | CUTANEOUS | 1 refills | Status: DC
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
## 2024-07-16 ENCOUNTER — Other Ambulatory Visit: Payer: Self-pay | Admitting: Adult Health

## 2024-07-16 DIAGNOSIS — R000 Tachycardia, unspecified: Secondary | ICD-10-CM

## 2024-07-18 ENCOUNTER — Encounter: Admitting: Orthopedic Surgery

## 2024-07-24 ENCOUNTER — Telehealth: Payer: Self-pay | Admitting: Orthopaedic Surgery
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
## 2024-07-24 NOTE — Telephone Encounter (Signed)
 Patient aware of the below message

## 2024-07-26 ENCOUNTER — Other Ambulatory Visit: Payer: Self-pay | Admitting: Adult Health

## 2024-07-26 ENCOUNTER — Telehealth: Payer: Self-pay

## 2024-07-26 DIAGNOSIS — R0683 Snoring: Secondary | ICD-10-CM

## 2024-07-26 NOTE — Telephone Encounter (Signed)
Sent referral for  sleep study  

## 2024-07-26 NOTE — Telephone Encounter (Signed)
 Copied from CRM [id number]. Topic: Clinical - Medical Advice >> Jul 26, 2024 11:17 AM Diannia H wrote: Reason for CRM: Patient is calling because she is needing a referral to a provider who can help her with her snoring. She said it has gotten so bad that she or her daughter can't sleep at night. The patient is not wanting to sleep at night because she is keeping everyone up. Could you please assist? Patients callback number is [phone].

## 2024-08-02 ENCOUNTER — Encounter: Payer: Self-pay | Admitting: Adult Health

## 2024-08-02 ENCOUNTER — Ambulatory Visit: Admitting: Adult Health

## 2024-08-02 VITALS — BP 116/68 | HR 82 | Temp 97.6°F | Resp 18 | Ht 65.0 in | Wt 281.4 lb

## 2024-08-02 DIAGNOSIS — Z1231 Encounter for screening mammogram for malignant neoplasm of breast: Secondary | ICD-10-CM | POA: Diagnosis not present

## 2024-08-02 DIAGNOSIS — Z0000 Encounter for general adult medical examination without abnormal findings: Secondary | ICD-10-CM | POA: Diagnosis not present

## 2024-08-02 NOTE — Progress Notes (Signed)
 Subjective:   Mariah Morrison is a 63 y.o. female who presents for Medicare Annual (Subsequent) preventive examination.  Visit Complete: In person  Patient Medicare AWV questionnaire was completed by the patient on 08/02/24; I have confirmed that all information answered by patient is correct and no changes since this date.  Cardiac Risk Factors include: family history of premature cardiovascular disease;hypertension;obesity (BMI >30kg/m2)     Objective:    Today's Vitals   08/02/24 1048 08/02/24 1059  BP: 116/68   Pulse: 82   Resp: 18   Temp: 97.6 F (36.4 C)   SpO2: 94%   Weight: 281 lb 6.4 oz (127.6 kg)   Height: 5' 5 (1.651 m)   PainSc: 10-Worst pain ever 10-Worst pain ever  PainLoc: Generalized    Body mass index is 46.83 kg/m.     08/02/2024   10:49 AM 08/22/2023   10:29 AM 07/22/2023   10:13 AM 07/15/2023    9:19 AM 02/17/2022    9:18 AM  Advanced Directives  Does Patient Have a Medical Advance Directive? No No No No No  Would patient like information on creating a medical advance directive? No - Patient declined No - Patient declined No - Patient declined No - Patient declined No - Patient declined    Current Medications (verified) Outpatient Encounter Medications as of 08/02/2024  Medication Sig   acetaminophen  (TYLENOL ) 650 MG CR tablet Take 650 mg by mouth every 8 (eight) hours as needed for pain.   celecoxib  (CELEBREX ) 200 MG capsule Take 1 capsule (200 mg total) by mouth 2 (two) times daily between meals as needed.   Incontinence Supply Disposable (DEPEND UNDERWEAR X-LARGE) MISC 1 Product by Does not apply route as directed.   Menthol-Camphor (TIGER BALM ARTHRITIS RUB) 11-11 % CREA Apply 1 application topically daily as needed (pain).   metoprolol  tartrate (LOPRESSOR ) 25 MG tablet TAKE ONE-HALF TABLET(12.5 MG) BY MOUTH TWICE DAILY   naproxen sodium (ALEVE) 220 MG tablet Take 220 mg by mouth daily as needed (Pain).   No facility-administered encounter  medications on file as of 08/02/2024.    Allergies (verified) No known allergies   History: Past Medical History:  Diagnosis Date   Anemia    w/pregnancy, no current problems   Arthritis    severe   History of kidney stones    passed stones   Past Surgical History:  Procedure Laterality Date   ABDOMINAL HYSTERECTOMY  06/07/2014   COLONOSCOPY     Family History  Problem Relation Age of Onset   Cancer Father 40   Heart disease Father    Colon cancer Father 4   Diabetes Neg Hx    Esophageal cancer Neg Hx    Rectal cancer Neg Hx    Stomach cancer Neg Hx    Colon polyps Neg Hx    Social History   Socioeconomic History   Marital status: Divorced    Spouse name: Not on file   Number of children: Not on file   Years of education: Not on file   Highest education level: Associate degree: occupational, Scientist, product/process development, or vocational program  Occupational History   Not on file  Tobacco Use   Smoking status: Never   Smokeless tobacco: Never  Vaping Use   Vaping status: Never Used  Substance and Sexual Activity   Alcohol use: No   Drug use: No   Sexual activity: Not Currently    Birth control/protection: Surgical    Comment: Hysterectomy  Other Topics Concern  Not on file  Social History Narrative   Not on file   Social Drivers of Health   Financial Resource Strain: High Risk (07/13/2024)   Overall Financial Resource Strain (CARDIA)    Difficulty of Paying Living Expenses: Hard  Food Insecurity: Food Insecurity Present (07/13/2024)   Hunger Vital Sign    Worried About Running Out of Food in the Last Year: Sometimes true    Ran Out of Food in the Last Year: Never true  Transportation Needs: No Transportation Needs (07/19/2023)   PRAPARE - Administrator, Civil Service (Medical): No    Lack of Transportation (Non-Medical): No  Physical Activity: Unknown (07/19/2023)   Exercise Vital Sign    Days of Exercise per Week: 0 days    Minutes of Exercise per  Session: Not on file  Stress: No Stress Concern Present (07/19/2023)   [employer] of Occupational Health - Occupational Stress Questionnaire    Feeling of Stress : Only a little  Social Connections: Socially Isolated (07/13/2024)   Social Connection and Isolation Panel    Frequency of Communication with Friends and Family: Three times a week    Frequency of Social Gatherings with Friends and Family: Once a week    Attends Religious Services: Never    Database administrator or Organizations: No    Attends Engineer, structural: Not on file    Marital Status: Divorced    Tobacco Counseling Counseling given: Not Answered   Clinical Intake:  Pre-visit preparation completed: Yes  Pain : 0-10 Pain Score: 10-Worst pain ever Pain Location: Other (Comment) (multiple sites) Pain Orientation: Other (Comment) (multiple sites) Pain Descriptors / Indicators: Aching Pain Onset: Other (comment) (chronic pain) Pain Frequency: Constant Pain Relieving Factors: Celebrex , Biofreeze gel, salonpas Effect of Pain on Daily Activities: continues to move and just rests for a while  Pain Relieving Factors: Celebrex , Biofreeze gel, salonpas  BMI - recorded: 45.53 Nutritional Status: BMI > 30  Obese Nutritional Risks: None Diabetes: No  How often do you need to have someone help you when you read instructions, pamphlets, or other written materials from your doctor or pharmacy?: 1 - Never What is the last grade level you completed in school?: some college  Interpreter Needed?: No  Information entered by :: Carmaleta Youngers Medina-Vargas DNP   Activities of Daily Living    08/02/2024   10:01 AM  In your present state of health, do you have any difficulty performing the following activities:  Hearing? 1  Vision? 0  Difficulty concentrating or making decisions? 0  Walking or climbing stairs? 1  Dressing or bathing? 1  Doing errands, shopping? 1  Preparing Food and eating ? N  Using the  Toilet? Y  In the past six months, have you accidently leaked urine? Y  Do you have problems with loss of bowel control? N  Managing your Medications? N  Managing your Finances? N  Housekeeping or managing your Housekeeping? Y    Patient Care Team: Morrison, Georgeanne Frankland C, NP as PCP - General (Internal Medicine)  Indicate any recent Medical Services you may have received from other than [facility] providers in the past year (date may be approximate).     Assessment:   This is a routine wellness examination for Mariah Morrison.  Hearing/Vision screen Hearing Screening - Comments:: Does have problem with hearing Vision Screening - Comments:: No problem with vision   Goals Addressed             This  Visit's Progress    Exercise 3x per week (30 min per time)       -  walk for 15 mins twice a day daily       Depression Screen    08/02/2024   10:51 AM 12/29/2023   11:22 AM 10/27/2023    1:21 PM 07/22/2023   10:12 AM 07/15/2023    9:18 AM 08/15/2019    9:02 AM  PHQ 2/9 Scores  PHQ - 2 Score 0 0 0 0 0 0  PHQ- 9 Score   0 0 0 0    Fall Risk    08/02/2024   10:01 AM 12/29/2023   11:21 AM 10/27/2023    1:21 PM 08/22/2023   10:29 AM 07/22/2023   10:12 AM  Fall Risk   Falls in the past year? 0 0 0 0 0  Number falls in past yr: 0 0 0 0 0  Injury with Fall? 0 0 0 0 0  Risk for fall due to :  No Fall Risks No Fall Risks No Fall Risks No Fall Risks  Follow up  Falls evaluation completed Falls evaluation completed Falls evaluation completed;Education provided;Falls prevention discussed Falls evaluation completed    MEDICARE RISK AT HOME: Medicare Risk at Home Any stairs in or around the home?: (Patient-Rptd) No If so, are there any without handrails?: (Patient-Rptd) Yes Home free of loose throw rugs in walkways, pet beds, electrical cords, etc?: (Patient-Rptd) No Adequate lighting in your home to reduce risk of falls?: (Patient-Rptd) Yes Life alert?: (Patient-Rptd) No Use of a cane, walker or  w/c?: (Patient-Rptd) Yes Grab bars in the bathroom?: (Patient-Rptd) Yes Shower chair or bench in shower?: (Patient-Rptd) Yes Elevated toilet seat or a handicapped toilet?: (Patient-Rptd) Yes  TIMED UP AND GO:  Was the test performed?  No    Cognitive Function:        08/02/2024   10:52 AM  6CIT Screen  What Year? 0 points  What month? 0 points  What time? 0 points  Count back from 20 0 points  Months in reverse 0 points  Repeat phrase 0 points  Total Score 0 points    Immunizations Immunization History  Administered Date(s) Administered   PFIZER Comirnaty(Gray Top)Covid-19 Tri-Sucrose Vaccine 03/19/2020, 04/09/2020   Pfizer Covid-19 Vaccine Bivalent Booster 69yrs & up 12/12/2020   Tdap 05/28/2023    TDAP status: Up to date  Flu Vaccine status: Up to date  Pneumococcal vaccine status: Due, Education has been provided regarding the importance of this vaccine. Advised may receive this vaccine at local pharmacy or Health Dept. Aware to provide a copy of the vaccination record if obtained from local pharmacy or Health Dept. Verbalized acceptance and understanding.  Covid-19 vaccine status: Information provided on how to obtain vaccines.   Qualifies for Shingles Vaccine? Yes   Zostavax completed Yes   Shingrix Completed?: Yes  Screening Tests Health Maintenance  Topic Date Due   Pneumococcal Vaccine: 50+ Years (1 of 2 - PCV) Never done   Zoster Vaccines- Shingrix (1 of 2) Never done   MAMMOGRAM  Never done   Colonoscopy  09/04/2024   INFLUENZA VACCINE  08/27/2024 (Originally 07/27/2024)   COVID-19 Vaccine (4 - 2024-25 season) 09/26/2024 (Originally 08/28/2023)   Cervical Cancer Screening (HPV/Pap Cotest)  10/26/2024 (Originally 05/13/2017)   Medicare Annual Wellness (AWV)  08/02/2025   DTaP/Tdap/Td (2 - Td or Tdap) 05/27/2033   Hepatitis C Screening  Completed   HIV Screening  Completed   Hepatitis  B Vaccines  Aged Out   HPV VACCINES  Aged Out   Meningococcal B Vaccine   Aged Out    Health Maintenance  Health Maintenance Due  Topic Date Due   Pneumococcal Vaccine: 50+ Years (1 of 2 - PCV) Never done   Zoster Vaccines- Shingrix (1 of 2) Never done   MAMMOGRAM  Never done   Colonoscopy  09/04/2024    Colorectal cancer screening: Type of screening: Colonoscopy. Completed 09/05/19. Repeat every 10 years  Mammogram status: Ordered 08/02/24. Pt provided with contact info and advised to call to schedule appt.   Bone Density status: Completed Declined. Results reflect: Declined   Lung Cancer Screening: (Low Dose CT Chest recommended if Age 55-80 years, 20 pack-year currently smoking OR have quit w/in 15years.) does not qualify.   Lung Cancer Screening Referral: N/A  Additional Screening:  Hepatitis C Screening: does qualify; Completed 07/20/23  Vision Screening: Recommended annual ophthalmology exams for early detection of glaucoma and other disorders of the eye. Is the patient up to date with their annual eye exam?  Yes  Who is the provider or what is the name of the office in which the patient attends annual eye exams? [facility] If pt is not established with a provider, would they like to be referred to a provider to establish care? No .   Dental Screening: Recommended annual dental exams for proper oral hygiene  Diabetic Foot Exam: Diabetic Foot Exam: Overdue, Pt has been advised about the importance in completing this exam. Pt is scheduled for diabetic foot exam on next visit.  Community Resource Referral / Chronic Care Management: CRR required this visit?  No   CCM required this visit?  No     Plan:     I have personally reviewed and noted the following in the patient's chart:   Medical and social history Use of alcohol, tobacco or illicit drugs  Current medications and supplements including opioid prescriptions. Patient is not currently taking opioid prescriptions. Functional ability and status Nutritional status Physical  activity Advanced directives List of other physicians Hospitalizations, surgeries, and ER visits in previous 12 months Vitals Screenings to include cognitive, depression, and falls Referrals and appointments  In addition, I have reviewed and discussed with patient certain preventive protocols, quality metrics, and best practice recommendations. A written personalized care plan for preventive services as well as general preventive health recommendations were provided to patient.     Mariah Fazzini Medina-Vargas, NP   08/02/2024   After Visit Summary: (In Person-Printed) AVS printed and given to the patient  Nurse Notes: Needs to have AWV yearly -  need Zoster and pneumonia vaccine -  declines COVID-19 booster

## 2024-08-02 NOTE — Patient Instructions (Signed)
  Mariah Morrison , Thank you for taking time to come for your Medicare Wellness Visit. I appreciate your ongoing commitment to your health goals. Please review the following plan we discussed and let me know if I can assist you in the future.   These are the goals we discussed:  Goals      Exercise 3x per week (30 min per time)     -  walk for 15 mins twice a day daily        This is a list of the screening recommended for you and due dates:  Health Maintenance  Topic Date Due   Pneumococcal Vaccine for age over 27 (1 of 2 - PCV) Never done   Zoster (Shingles) Vaccine (1 of 2) Never done   Mammogram  Never done   Colon Cancer Screening  09/04/2024   Flu Shot  08/27/2024*   COVID-19 Vaccine (4 - 2024-25 season) 09/26/2024*   Pap with HPV screening  10/26/2024*   Medicare Annual Wellness Visit  08/02/2025   DTaP/Tdap/Td vaccine (2 - Td or Tdap) 05/27/2033   Hepatitis C Screening  Completed   HIV Screening  Completed   Hepatitis B Vaccine  Aged Out   HPV Vaccine  Aged Out   Meningitis B Vaccine  Aged Out  *Topic was postponed. The date shown is not the original due date.

## 2024-09-05 ENCOUNTER — Ambulatory Visit (INDEPENDENT_AMBULATORY_CARE_PROVIDER_SITE_OTHER): Admitting: Neurology

## 2024-09-05 ENCOUNTER — Encounter: Payer: Self-pay | Admitting: Neurology

## 2024-09-05 ENCOUNTER — Other Ambulatory Visit

## 2024-09-05 VITALS — BP 104/66 | HR 70 | Ht 65.0 in | Wt 279.0 lb

## 2024-09-05 DIAGNOSIS — Z6841 Body Mass Index (BMI) 40.0 and over, adult: Secondary | ICD-10-CM

## 2024-09-05 DIAGNOSIS — Z9189 Other specified personal risk factors, not elsewhere classified: Secondary | ICD-10-CM | POA: Diagnosis not present

## 2024-09-05 DIAGNOSIS — R519 Headache, unspecified: Secondary | ICD-10-CM | POA: Diagnosis not present

## 2024-09-05 DIAGNOSIS — R0683 Snoring: Secondary | ICD-10-CM

## 2024-09-05 DIAGNOSIS — R058 Other specified cough: Secondary | ICD-10-CM

## 2024-09-05 DIAGNOSIS — G4719 Other hypersomnia: Secondary | ICD-10-CM

## 2024-09-05 DIAGNOSIS — Z820 Family history of epilepsy and other diseases of the nervous system: Secondary | ICD-10-CM

## 2024-09-05 DIAGNOSIS — R6889 Other general symptoms and signs: Secondary | ICD-10-CM

## 2024-09-05 NOTE — Progress Notes (Signed)
 Subjective:    Patient ID: Mariah Morrison is a 63 y.o. female.  HPI    True Mar, MD, PhD [facility] [address], [address] [address] [place], [place] [address]  Dear Jereld,  I saw your patient, Mariah Morrison, upon your kind request in my sleep clinic today for initial consultation of her sleep disorder, in particular, concern for underlying obstructive sleep apnea.  The patient is accompanied by her daughter today.  As you know, [name] is a 64 year old female with an underlying medical history of cellulitis, osteoarthritis, tachycardia, anemia, history of kidney stones, and morbid obesity with a BMI of over 45, who reports snoring and excessive daytime somnolence.  Her Epworth sleepiness score is 9 out of 24, fatigue severity score is 24 out of 63.  I reviewed your office note from 07/13/2024. Per daughter, patient wakes up with a jerk at times and has had nocturnal cough as well.  Patient has a family history of snoring.  She lives with her daughter, they have 1 cat in the household, the cat sleeps in her daughter's room.  Daughter has been diagnosed with sleep apnea.  Patient reports a bedtime of around 10 and rise time around 9.  She has no nightly nocturia but has had occasional morning headaches.  She does not work.  She drinks caffeine in the form of coffee, usually 1 cup in the morning.  She has had weight fluctuation, lost about 30 pounds last year in the context of her hospitalization for kidney stone but then gained most if not more back.  She is working on weight loss.  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  She does not drink any alcohol.  She is a non-smoker.  Her Past Medical History Is Significant For: Past Medical History:  Diagnosis Date   Anemia    w/pregnancy, no current problems   Arthritis    severe   History of kidney stones    passed stones    Her Past Surgical History Is Significant For: Past Surgical  History:  Procedure Laterality Date   ABDOMINAL HYSTERECTOMY  06/07/2014   COLONOSCOPY      Her Family History Is Significant For: Family History  Problem Relation Age of Onset   Snoring Mother    Cancer Father 68   Heart disease Father    Colon cancer Father 56   Snoring Brother    Snoring Daughter    Diabetes Neg Hx    Esophageal cancer Neg Hx    Rectal cancer Neg Hx    Stomach cancer Neg Hx    Colon polyps Neg Hx    Sleep apnea Neg Hx     Her Social History Is Significant For: Social History   Socioeconomic History   Marital status: Divorced    Spouse name: Not on file   Number of children: Not on file   Years of education: Not on file   Highest education level: Associate degree: occupational, Scientist, product/process development, or vocational program  Occupational History   Not on file  Tobacco Use   Smoking status: Never   Smokeless tobacco: Never  Vaping Use   Vaping status: Never Used  Substance and Sexual Activity   Alcohol use: No   Drug use: No   Sexual activity: Not Currently    Birth control/protection: Surgical    Comment: Hysterectomy  Other Topics Concern   Not on file  Social History Narrative   Lives with daughter  Right handed   Caffeine: 1 cup coffee in the morning   Social Drivers of Health   Financial Resource Strain: High Risk (07/13/2024)   Overall Financial Resource Strain (CARDIA)    Difficulty of Paying Living Expenses: Hard  Food Insecurity: Food Insecurity Present (07/13/2024)   Hunger Vital Sign    Worried About Running Out of Food in the Last Year: Sometimes true    Ran Out of Food in the Last Year: Never true  Transportation Needs: No Transportation Needs (07/19/2023)   PRAPARE - Administrator, Civil Service (Medical): No    Lack of Transportation (Non-Medical): No  Physical Activity: Unknown (07/19/2023)   Exercise Vital Sign    Days of Exercise per Week: 0 days    Minutes of Exercise per Session: Not on file  Stress: No Stress  Concern Present (07/19/2023)   [employer] of Occupational Health - Occupational Stress Questionnaire    Feeling of Stress : Only a little  Social Connections: Socially Isolated (07/13/2024)   Social Connection and Isolation Panel    Frequency of Communication with Friends and Family: Three times a week    Frequency of Social Gatherings with Friends and Family: Once a week    Attends Religious Services: Never    Database administrator or Organizations: No    Attends Engineer, structural: Not on file    Marital Status: Divorced    Her Allergies Are:  Allergies  Allergen Reactions   No Known Allergies   :   Her Current Medications Are:  Outpatient Encounter Medications as of 09/05/2024  Medication Sig   acetaminophen  (TYLENOL ) 650 MG CR tablet Take 650 mg by mouth every 8 (eight) hours as needed for pain.   celecoxib  (CELEBREX ) 200 MG capsule Take 1 capsule (200 mg total) by mouth 2 (two) times daily between meals as needed.   Incontinence Supply Disposable (DEPEND UNDERWEAR X-LARGE) MISC 1 Product by Does not apply route as directed.   Menthol-Camphor (TIGER BALM ARTHRITIS RUB) 11-11 % CREA Apply 1 application topically daily as needed (pain).   metoprolol  tartrate (LOPRESSOR ) 25 MG tablet TAKE ONE-HALF TABLET(12.5 MG) BY MOUTH TWICE DAILY   naproxen sodium (ALEVE) 220 MG tablet Take 220 mg by mouth daily as needed (Pain).   No facility-administered encounter medications on file as of 09/05/2024.  :   Review of Systems:  Out of a complete 14 point review of systems, all are reviewed and negative with the exception of these symptoms as listed below:  Review of Systems  Neurological:        Patient is here with her daughter for sleep consult. Patient's mother, daughter, and brother snore heavily. Daughter states patient will jerk awake and cough while sleeping. ESS 9 FSS 24.    Objective:  Neurological Exam  Physical Exam Physical Examination:   Vitals:    09/05/24 1248  BP: 104/66  Pulse: 70    General Examination: The patient is a very pleasant 63 y.o. female in no acute distress. She appears well-developed and well-nourished and well groomed.   HEENT: Normocephalic, atraumatic, pupils are equal, round and reactive to light, extraocular tracking is good without limitation to gaze excursion or nystagmus noted. Hearing is grossly intact. Face is symmetric with normal facial animation. Speech is clear with no dysarthria noted. There is no hypophonia. There is no lip, neck/head, jaw or voice tremor. Neck is supple with full range of passive and active motion. There are no carotid  bruits on auscultation. Oropharynx exam reveals: moderate mouth dryness, marginal dental hygiene with several teeth missing, moderate airway crowding secondary to small airway entry, tonsillar size about 1+, neck circumference 18-5/8 inches, mild to moderate overbite noted.  Tongue protrudes centrally and palate elevates symmetrically.   Chest: Clear to auscultation without wheezing, rhonchi or crackles noted.  Heart: S1+S2+0, regular and normal without murmurs, rubs or gallops noted.   Abdomen: Soft, non-tender and non-distended.  Extremities: There is 1+ pitting edema in the distal lower extremities bilaterally.   Skin: Warm and dry without trophic changes noted.   Musculoskeletal: exam reveals limited range of motion in the legs, reports bilateral hip and knee pain, currently situated in a clinic wheelchair.     Neurologically:  Mental status: The patient is awake, alert and oriented in all 4 spheres. Her immediate and remote memory, attention, language skills and fund of knowledge are appropriate. There is no evidence of aphasia, agnosia, apraxia or anomia. Speech is clear with normal prosody and enunciation. Thought process is linear. Mood is normal and affect is normal.  Cranial nerves II - XII are as described above under HEENT exam.  Motor exam: Normal bulk,  strength and tone is noted. There is no obvious action or resting tremor.  Fine motor skills and coordination: grossly intact.  Cerebellar testing: No dysmetria or intention tremor. There is no truncal or gait ataxia.  Sensory exam: intact to light touch in the upper and lower extremities.   Assessment and Plan:  In summary, Tyneshia Ford is a very pleasant 63 y.o.-year old female with an underlying medical history of cellulitis, osteoarthritis, tachycardia, anemia, history of kidney stones, and morbid obesity with a BMI of over 45, whose history and physical exam are concerning for sleep disordered breathing, particularly obstructive sleep apnea (OSA). A laboratory attended sleep study is typically considered gold standard for evaluation of sleep disordered breathing.   I had a long chat with the patient and her daughter about my findings and the diagnosis of sleep apnea, particularly OSA, its prognosis and treatment options. We talked about medical/conservative treatments, surgical interventions and non-pharmacological approaches for symptom control. I explained, in particular, the risks and ramifications of untreated moderate to severe OSA, especially with respect to developing cardiovascular disease down the road, including congestive heart failure (CHF), difficult to treat hypertension, cardiac arrhythmias (particularly A-fib), neurovascular complications including TIA, stroke and dementia. Even type 2 diabetes has, in part, been linked to untreated OSA. Symptoms of untreated OSA may include (but may not be limited to) daytime sleepiness, nocturia (i.e. frequent nighttime urination), memory problems, mood irritability and suboptimally controlled or worsening mood disorder such as depression and/or anxiety, lack of energy, lack of motivation, physical discomfort, as well as recurrent headaches, especially morning or nocturnal headaches. We talked about the importance of maintaining a healthy lifestyle  and striving for healthy weight. In addition, we talked about the importance of striving for and maintaining good sleep hygiene. I recommended a sleep study at this time. I outlined the differences between a laboratory attended sleep study which is considered more comprehensive and accurate over the option of a home sleep test (HST); the latter may lead to underestimation of sleep disordered breathing in some instances and does not help with diagnosing upper airway resistance syndrome and is not accurate enough to diagnose primary central sleep apnea typically. I outlined possible surgical and non-surgical treatment options of OSA, including the use of a positive airway pressure (PAP) device (i.e. CPAP, AutoPAP/APAP  or BiPAP in certain circumstances), a custom-made dental device (aka oral appliance, which would require a referral to a specialist dentist or orthodontist typically, and is generally speaking not considered for patients with full dentures or edentulous state), upper airway surgical options, such as traditional UPPP (which is not considered a first-line treatment) or the Inspire device (hypoglossal nerve stimulator, which would involve a referral for consultation with an ENT surgeon, after careful selection, following inclusion criteria - also not first-line treatment). I explained the PAP treatment option to the patient in detail, as this is generally considered first-line treatment.  The patient indicated that she would be willing to try PAP therapy, if the need arises. I explained the importance of being compliant with PAP treatment, not only for insurance purposes but primarily to improve patient's symptoms symptoms, and for the patient's long term health benefit, including to reduce Her cardiovascular risks longer-term.    We will pick up our discussion about the next steps and treatment options after testing.  We will keep her posted as to the test results by phone call and/or MyChart messaging  where possible.  We will plan to follow-up in sleep clinic accordingly as well.  I answered all her questions today and the patient was in agreement.   I encouraged her to call with any interim questions, concerns, problems or updates or email us  through MyChart.  Generally speaking, sleep test authorizations may take up to 2 weeks, sometimes less, sometimes longer, the patient is encouraged to get in touch with us  if they do not hear back from the sleep lab staff directly within the next 2 weeks.  Thank you very much for allowing me to participate in the care of this nice patient. If I can be of any further assistance to you please do not hesitate to call me at [phone].  Sincerely,   True Mar, MD, PhD

## 2024-09-05 NOTE — Patient Instructions (Signed)

## 2024-09-17 ENCOUNTER — Ambulatory Visit: Admitting: Orthopaedic Surgery

## 2024-09-19 ENCOUNTER — Ambulatory Visit: Admitting: Orthopaedic Surgery

## 2024-09-19 ENCOUNTER — Telehealth: Payer: Self-pay | Admitting: Neurology

## 2024-09-19 DIAGNOSIS — M7062 Trochanteric bursitis, left hip: Secondary | ICD-10-CM | POA: Diagnosis not present

## 2024-09-19 DIAGNOSIS — M7061 Trochanteric bursitis, right hip: Secondary | ICD-10-CM

## 2024-09-19 MED ORDER — LIDOCAINE HCL 1 % IJ SOLN
3.0000 mL | INTRAMUSCULAR | Status: AC | PRN
  Administered 2024-09-19: 3 mL

## 2024-09-19 MED ORDER — METHYLPREDNISOLONE ACETATE 40 MG/ML IJ SUSP
40.0000 mg | INTRAMUSCULAR | Status: AC | PRN
  Administered 2024-09-19: 40 mg via INTRA_ARTICULAR

## 2024-09-19 NOTE — Telephone Encounter (Signed)
 [employer] medicare/medicaid no auth req.  Spoke with patient she is scheduled at [facility] for 10/24/24 at 8 pm.  Mailed packet and sent mychart

## 2024-09-19 NOTE — Progress Notes (Signed)
 The patient is well-known to us .  She has severe dysplastic hips and comes in from time to time for steroid injections over the trochanteric area of her hips.  She also has arthritic changes in her knees.  We did last placed 2 injections in both knees just in July.  She is requesting steroid injections in both hips.  She had these last in March.  Her daughter is with her today as well.  She has limited mobility.  She is 63 years old.  Both hips have tenderness over the trochanteric area on I did successfully place a steroid injection around both areas that she tolerated well.  She knows to wait a minimum of 3 months between injections and hopefully longer.  I have recommended aquatic therapy as well.    Procedure Note  Patient: Mariah Morrison             Date of Birth: 1961-09-02           MRN: [id number]             Visit Date: 09/19/2024  Procedures: Visit Diagnoses:  1. Trochanteric bursitis, left hip   2. Trochanteric bursitis, right hip     Large Joint Inj: R greater trochanter on 09/19/2024 1:31 PM Indications: pain and diagnostic evaluation Details: 22 G 1.5 in needle, lateral approach  Arthrogram: No  Medications: 3 mL lidocaine  1 %; 40 mg methylPREDNISolone  acetate 40 MG/ML Outcome: tolerated well, no immediate complications Procedure, treatment alternatives, risks and benefits explained, specific risks discussed. Consent was given by the patient. Immediately prior to procedure a time out was called to verify the correct patient, procedure, equipment, support staff and site/side marked as required. Patient was prepped and draped in the usual sterile fashion.    Large Joint Inj: L greater trochanter on 09/19/2024 1:32 PM Indications: pain and diagnostic evaluation Details: 22 G 1.5 in needle, lateral approach  Arthrogram: No  Medications: 3 mL lidocaine  1 %; 40 mg methylPREDNISolone  acetate 40 MG/ML Outcome: tolerated well, no immediate complications Procedure, treatment  alternatives, risks and benefits explained, specific risks discussed. Consent was given by the patient. Immediately prior to procedure a time out was called to verify the correct patient, procedure, equipment, support staff and site/side marked as required. Patient was prepped and draped in the usual sterile fashion.

## 2024-10-10 ENCOUNTER — Other Ambulatory Visit: Payer: Self-pay

## 2024-10-10 ENCOUNTER — Telehealth: Payer: Self-pay | Admitting: Orthopaedic Surgery

## 2024-10-10 MED ORDER — CELECOXIB 200 MG PO CAPS: 200.0000 mg | ORAL_CAPSULE | Freq: Two times a day (BID) | ORAL | 3 refills | Status: DC | PRN

## 2024-10-10 NOTE — Telephone Encounter (Signed)
 Patient called. She would like a refill on Celebrex  called in.

## 2024-10-11 ENCOUNTER — Other Ambulatory Visit: Payer: Self-pay | Admitting: Orthopaedic Surgery

## 2024-10-24 ENCOUNTER — Ambulatory Visit: Admitting: Neurology

## 2024-10-24 ENCOUNTER — Telehealth: Payer: Self-pay | Admitting: Neurology

## 2024-10-24 DIAGNOSIS — R0683 Snoring: Secondary | ICD-10-CM

## 2024-10-24 DIAGNOSIS — G4720 Circadian rhythm sleep disorder, unspecified type: Secondary | ICD-10-CM

## 2024-10-24 DIAGNOSIS — Z9189 Other specified personal risk factors, not elsewhere classified: Secondary | ICD-10-CM

## 2024-10-24 DIAGNOSIS — R058 Other specified cough: Secondary | ICD-10-CM

## 2024-10-24 DIAGNOSIS — G4719 Other hypersomnia: Secondary | ICD-10-CM

## 2024-10-24 DIAGNOSIS — Z820 Family history of epilepsy and other diseases of the nervous system: Secondary | ICD-10-CM

## 2024-10-24 DIAGNOSIS — G4733 Obstructive sleep apnea (adult) (pediatric): Secondary | ICD-10-CM

## 2024-10-24 DIAGNOSIS — R519 Headache, unspecified: Secondary | ICD-10-CM

## 2024-10-24 DIAGNOSIS — R6889 Other general symptoms and signs: Secondary | ICD-10-CM

## 2024-10-24 NOTE — Telephone Encounter (Signed)
 Pt daughter called to confirm appt   Appt Confirm

## 2024-10-26 NOTE — Procedures (Signed)
 Physician Interpretation: Please see link under Procedure Tab or under Encounters tab for physician report, technical report, as well as O2 titration and/or PAP titration tables (if applicable).

## 2024-10-29 ENCOUNTER — Encounter: Payer: Self-pay | Admitting: Radiology

## 2024-10-31 NOTE — Progress Notes (Signed)
 Mariah Morrison                                          MRN: [id number]   10/31/2024   The [facility] Quality Team Specialist reviewed this patient medical record for the purposes of chart review for care gap closure. The following were reviewed: chart review for care gap closure-breast cancer screening and diabetic eye exam.    [facility] Quality Team

## 2024-11-02 ENCOUNTER — Ambulatory Visit: Payer: Self-pay | Admitting: Neurology

## 2024-11-02 DIAGNOSIS — G4733 Obstructive sleep apnea (adult) (pediatric): Secondary | ICD-10-CM

## 2024-11-07 NOTE — Progress Notes (Signed)
 LVM for daughter to give me call back to discuss mother's sleep study results.

## 2024-11-10 ENCOUNTER — Other Ambulatory Visit: Payer: Self-pay | Admitting: Orthopaedic Surgery

## 2024-11-28 ENCOUNTER — Ambulatory Visit: Admitting: Orthopaedic Surgery

## 2024-11-29 ENCOUNTER — Ambulatory Visit: Admitting: Physician Assistant

## 2024-11-29 ENCOUNTER — Encounter: Payer: Self-pay | Admitting: Physician Assistant

## 2024-11-29 DIAGNOSIS — M1712 Unilateral primary osteoarthritis, left knee: Secondary | ICD-10-CM

## 2024-11-29 DIAGNOSIS — M170 Bilateral primary osteoarthritis of knee: Secondary | ICD-10-CM | POA: Diagnosis not present

## 2024-11-29 DIAGNOSIS — M1711 Unilateral primary osteoarthritis, right knee: Secondary | ICD-10-CM

## 2024-11-29 MED ORDER — LIDOCAINE HCL 1 % IJ SOLN
3.0000 mL | INTRAMUSCULAR | Status: AC | PRN
  Administered 2024-11-29: 3 mL

## 2024-11-29 MED ORDER — METHYLPREDNISOLONE ACETATE 40 MG/ML IJ SUSP
40.0000 mg | INTRAMUSCULAR | Status: AC | PRN
  Administered 2024-11-29: 40 mg via INTRA_ARTICULAR

## 2024-11-29 NOTE — Progress Notes (Signed)
   Procedure Note  Patient: Mariah Morrison             Date of Birth: [birth year]           MRN: [id number]             Visit Date: 11/29/2024 HPI: Patient is a 63 year old female well-known to Dr. Jacob service.  She has severe end-stage arthritis of both knees.  She comes in today requesting cortisone injections both knees.  Last injections were 06/27/2024.  Did well until recently.  No fevers chills.  Physical exam: Bilateral knees no abnormal warmth erythema or effusion. Procedures: Visit Diagnoses:  1. Unilateral primary osteoarthritis, right knee   2. Unilateral primary osteoarthritis, left knee     Large Joint Inj: bilateral knee on 11/29/2024 2:29 PM Indications: pain Details: 22 G 1.5 in needle, anterolateral approach  Arthrogram: No  Medications (Right): 3 mL lidocaine  1 %; 40 mg methylPREDNISolone  acetate 40 MG/ML Medications (Left): 3 mL lidocaine  1 %; 40 mg methylPREDNISolone  acetate 40 MG/ML Outcome: tolerated well, no immediate complications Procedure, treatment alternatives, risks and benefits explained, specific risks discussed. Consent was given by the patient. Immediately prior to procedure a time out was called to verify the correct patient, procedure, equipment, support staff and site/side marked as required. Patient was prepped and draped in the usual sterile fashion.     Plan: She knows to wait at least 3 months between cortisone injections.  Questions were encouraged and answered at length.

## 2024-12-03 ENCOUNTER — Other Ambulatory Visit: Payer: Self-pay | Admitting: Orthopaedic Surgery

## 2024-12-14 NOTE — Progress Notes (Signed)
 Unable to lvm on 11/19/24.   LVM 12/13/24.

## 2024-12-31 ENCOUNTER — Other Ambulatory Visit: Payer: Self-pay | Admitting: Orthopaedic Surgery

## 2024-12-31 ENCOUNTER — Other Ambulatory Visit: Payer: Self-pay | Admitting: Adult Health

## 2024-12-31 DIAGNOSIS — R000 Tachycardia, unspecified: Secondary | ICD-10-CM

## 2025-01-02 ENCOUNTER — Other Ambulatory Visit: Payer: Self-pay | Admitting: Adult Health

## 2025-01-02 DIAGNOSIS — N61 Mastitis without abscess: Secondary | ICD-10-CM

## 2025-01-03 ENCOUNTER — Telehealth: Payer: Self-pay

## 2025-01-03 NOTE — Telephone Encounter (Signed)
 Called and spoke with patient-patient advised of colon recall and is agreeable with plan but states she will need to call back to the office to schedule as she is driving right now and can't make the appt; Patient advised to call back to the office at [phone] should questions/concerns arise;  Patient verbalized understanding of information/instructions;

## 2025-01-17 ENCOUNTER — Ambulatory Visit: Admitting: Orthopaedic Surgery

## 2025-01-23 ENCOUNTER — Encounter: Payer: Self-pay | Admitting: *Deleted

## 2025-01-23 ENCOUNTER — Ambulatory Visit: Admitting: Orthopaedic Surgery

## 2025-01-23 NOTE — Progress Notes (Signed)
 Mariah Morrison                                          MRN: [id number]   01/23/2025   The [facility] Quality Team Specialist reviewed this patient medical record for the purposes of chart review for care gap closure. The following were reviewed: chart review for care gap closure-glycemic status assessment.    [facility] Quality Team

## 2025-01-31 ENCOUNTER — Telehealth: Payer: Self-pay

## 2025-01-31 NOTE — Telephone Encounter (Signed)
 Copied from CRM [id number]. Topic: Clinical - Prescription Issue >> Jan 31, 2025 12:35 PM Graeme ORN wrote: Reason for CRM: Patient daughter called. States [facility] reached out to them about order sent to provider. Have not heard anything back. Requesting provider complete order and send back. Thank You    ----------------------------------------------------------------------- From previous Reason for Contact - Order For Equipment (DME): Reason for CRM:

## 2025-01-31 NOTE — Telephone Encounter (Signed)
 Medina-Vargas, Monina C, NP please advise if you have seen a form from the specified company

## 2025-01-31 NOTE — Telephone Encounter (Signed)
 Nothing was faxed so far.

## 2025-02-27 ENCOUNTER — Ambulatory Visit: Admitting: Orthopaedic Surgery
